# Patient Record
Sex: Male | Born: 1965 | Race: White | Hispanic: No | Marital: Single | Smoking: Current every day smoker
Health system: Southern US, Community
[De-identification: ages and names within clinical notes are randomized; demographics above are authoritative.]

## PROBLEM LIST (undated history)

## (undated) DIAGNOSIS — K219 Gastro-esophageal reflux disease without esophagitis: Secondary | ICD-10-CM

## (undated) DIAGNOSIS — F1910 Other psychoactive substance abuse, uncomplicated: Secondary | ICD-10-CM

## (undated) HISTORY — DX: Gastro-esophageal reflux disease without esophagitis: K21.9

## (undated) HISTORY — DX: Other psychoactive substance abuse, uncomplicated: F19.10

## (undated) HISTORY — PX: KNEE ARTHROSCOPY: SHX127

## (undated) HISTORY — PX: WRIST SURGERY: SHX841

---

## 1997-10-04 ENCOUNTER — Emergency Department (HOSPITAL_COMMUNITY): Admission: EM | Admit: 1997-10-04 | Discharge: 1997-10-04 | Payer: Self-pay | Admitting: Emergency Medicine

## 1998-03-03 ENCOUNTER — Encounter: Payer: Self-pay | Admitting: Internal Medicine

## 1998-03-03 ENCOUNTER — Emergency Department (HOSPITAL_COMMUNITY): Admission: EM | Admit: 1998-03-03 | Discharge: 1998-03-03 | Payer: Self-pay | Admitting: Internal Medicine

## 1998-10-15 ENCOUNTER — Inpatient Hospital Stay (HOSPITAL_COMMUNITY): Admission: EM | Admit: 1998-10-15 | Discharge: 1998-10-19 | Payer: Self-pay | Admitting: Emergency Medicine

## 1998-11-08 ENCOUNTER — Emergency Department (HOSPITAL_COMMUNITY): Admission: EM | Admit: 1998-11-08 | Discharge: 1998-11-08 | Payer: Self-pay | Admitting: Emergency Medicine

## 1998-11-09 ENCOUNTER — Inpatient Hospital Stay (HOSPITAL_COMMUNITY): Admission: EM | Admit: 1998-11-09 | Discharge: 1998-11-14 | Payer: Self-pay | Admitting: Emergency Medicine

## 2001-10-06 ENCOUNTER — Encounter (INDEPENDENT_AMBULATORY_CARE_PROVIDER_SITE_OTHER): Payer: Self-pay | Admitting: Specialist

## 2001-10-07 ENCOUNTER — Inpatient Hospital Stay (HOSPITAL_COMMUNITY): Admission: EM | Admit: 2001-10-07 | Discharge: 2001-10-14 | Payer: Self-pay | Admitting: Emergency Medicine

## 2001-10-08 ENCOUNTER — Encounter: Payer: Self-pay | Admitting: Gastroenterology

## 2001-10-09 ENCOUNTER — Encounter: Payer: Self-pay | Admitting: Internal Medicine

## 2001-10-10 ENCOUNTER — Encounter: Payer: Self-pay | Admitting: Internal Medicine

## 2001-10-11 ENCOUNTER — Encounter: Payer: Self-pay | Admitting: Internal Medicine

## 2001-11-13 ENCOUNTER — Emergency Department (HOSPITAL_COMMUNITY): Admission: EM | Admit: 2001-11-13 | Discharge: 2001-11-13 | Payer: Self-pay | Admitting: Emergency Medicine

## 2002-09-12 ENCOUNTER — Emergency Department (HOSPITAL_COMMUNITY): Admission: EM | Admit: 2002-09-12 | Discharge: 2002-09-12 | Payer: Self-pay | Admitting: Emergency Medicine

## 2002-09-12 ENCOUNTER — Encounter: Payer: Self-pay | Admitting: Emergency Medicine

## 2003-01-09 ENCOUNTER — Encounter: Admission: RE | Admit: 2003-01-09 | Discharge: 2003-01-09 | Payer: Self-pay | Admitting: Neurosurgery

## 2003-01-09 ENCOUNTER — Encounter: Payer: Self-pay | Admitting: Neurosurgery

## 2003-04-17 ENCOUNTER — Emergency Department (HOSPITAL_COMMUNITY): Admission: EM | Admit: 2003-04-17 | Discharge: 2003-04-17 | Payer: Self-pay | Admitting: Emergency Medicine

## 2004-03-05 ENCOUNTER — Emergency Department (HOSPITAL_COMMUNITY): Admission: EM | Admit: 2004-03-05 | Discharge: 2004-03-05 | Payer: Self-pay | Admitting: Emergency Medicine

## 2005-07-31 ENCOUNTER — Emergency Department (HOSPITAL_COMMUNITY): Admission: EM | Admit: 2005-07-31 | Discharge: 2005-07-31 | Payer: Self-pay | Admitting: Emergency Medicine

## 2005-09-07 ENCOUNTER — Emergency Department (HOSPITAL_COMMUNITY): Admission: EM | Admit: 2005-09-07 | Discharge: 2005-09-07 | Payer: Self-pay | Admitting: Emergency Medicine

## 2005-10-09 ENCOUNTER — Emergency Department (HOSPITAL_COMMUNITY): Admission: EM | Admit: 2005-10-09 | Discharge: 2005-10-10 | Payer: Self-pay | Admitting: Emergency Medicine

## 2006-08-31 IMAGING — CR DG RIBS W/ CHEST 3+V BILAT
8 series · 8 of 8 positions shown · non-contrast
Comparison: Chest radiograph 03/05/2004.

CLINICAL DATA: Back pain, chest pain. 
 BILATERAL RIBS WITH CHEST - 9 VIEWS:

[w chest pa]
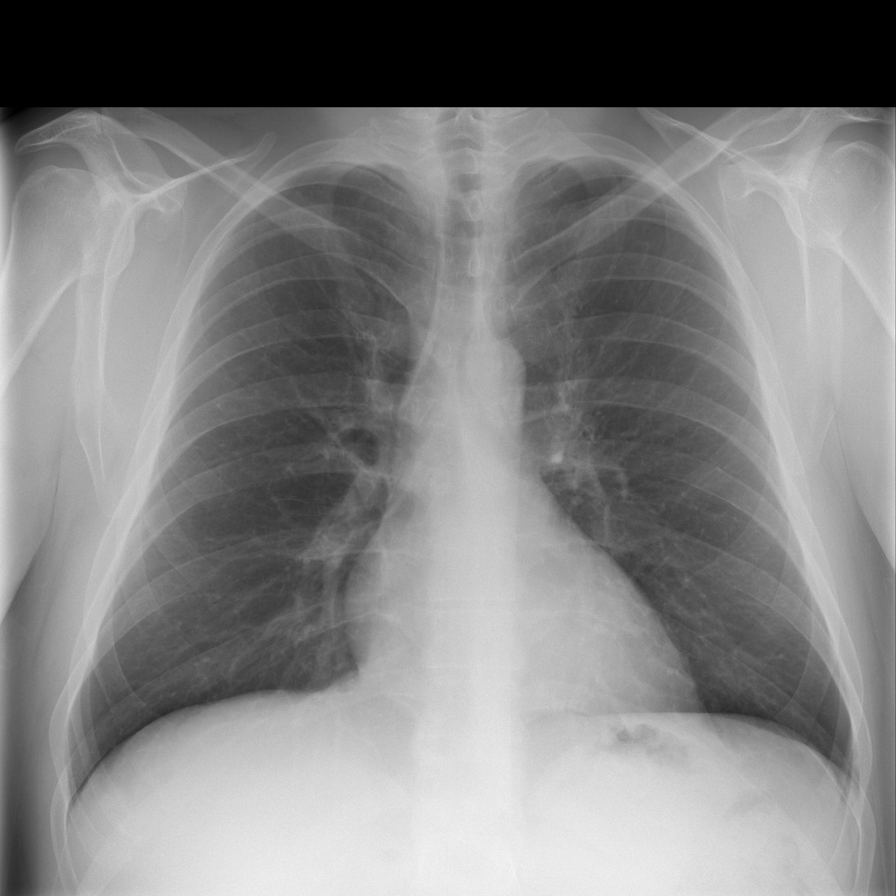

[w ribs ap/pa upper left *]
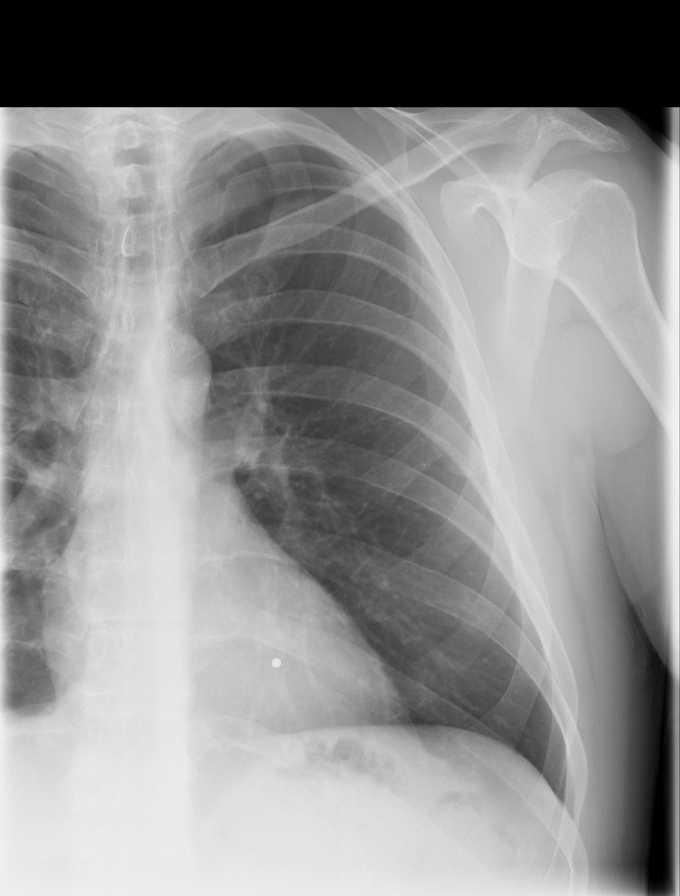

[w ribs ap/pa lower left]
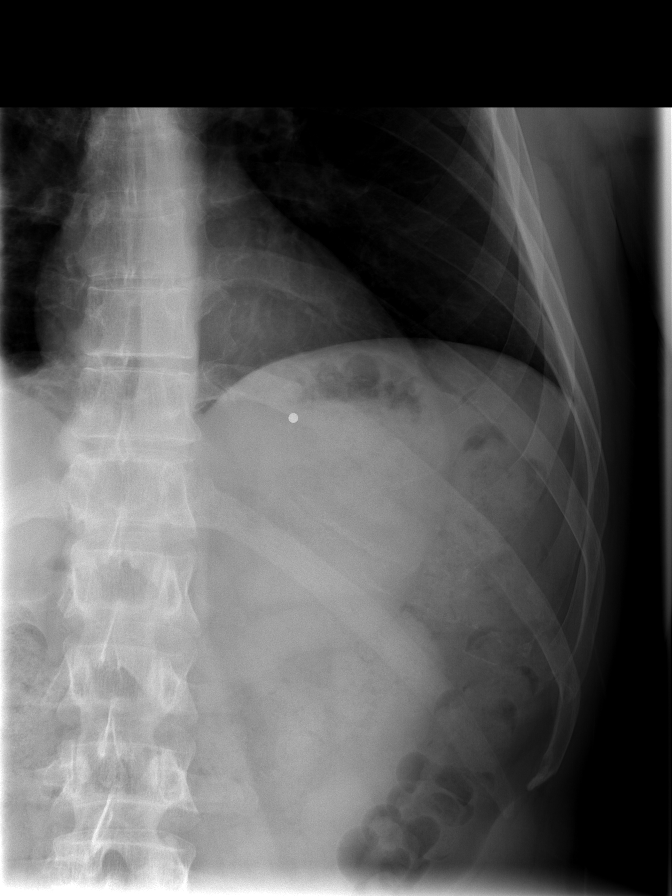

[w ribs oblique left (1 of 2)]
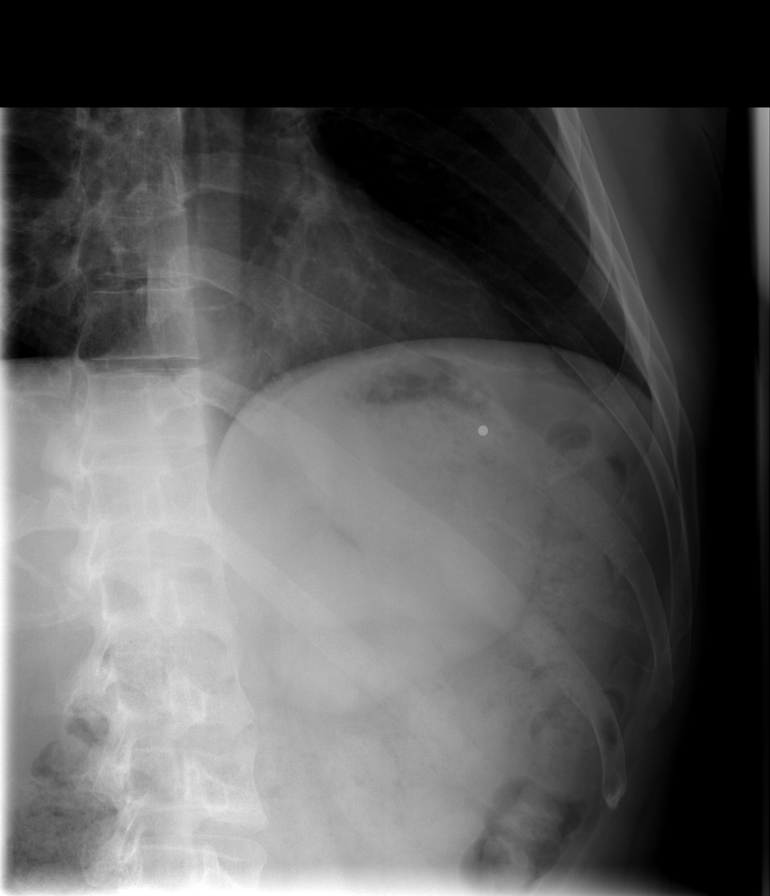

[w ribs oblique left (2 of 2)]
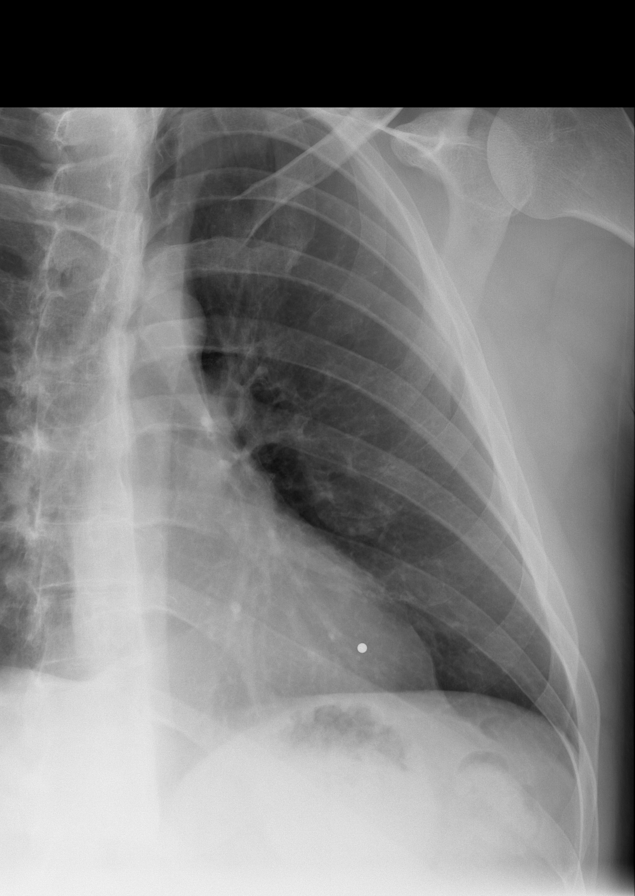

[w ribs ap/pa upper right]
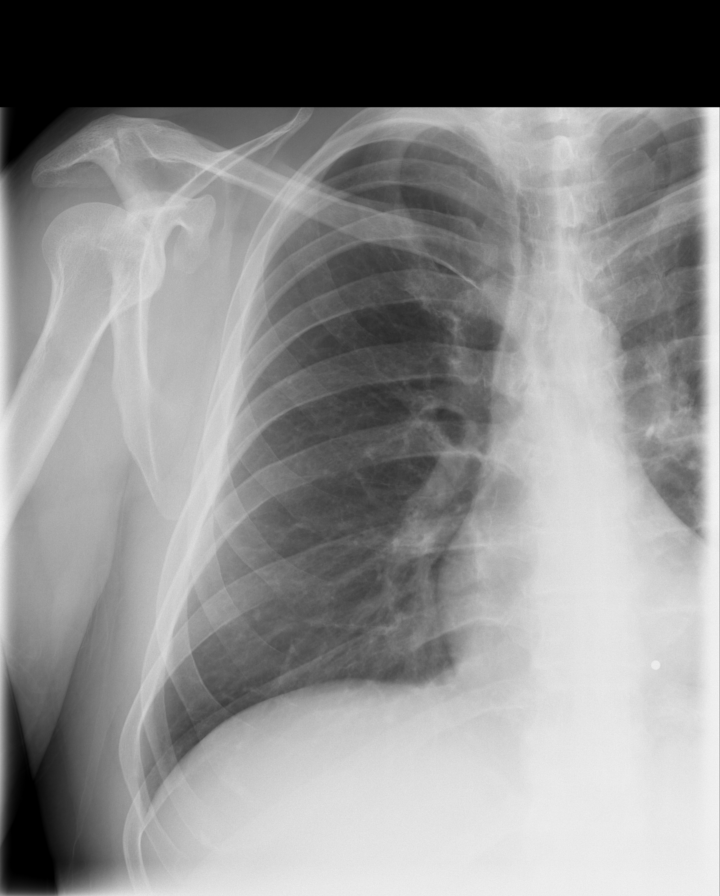

[w ribs ap/pa lower right]
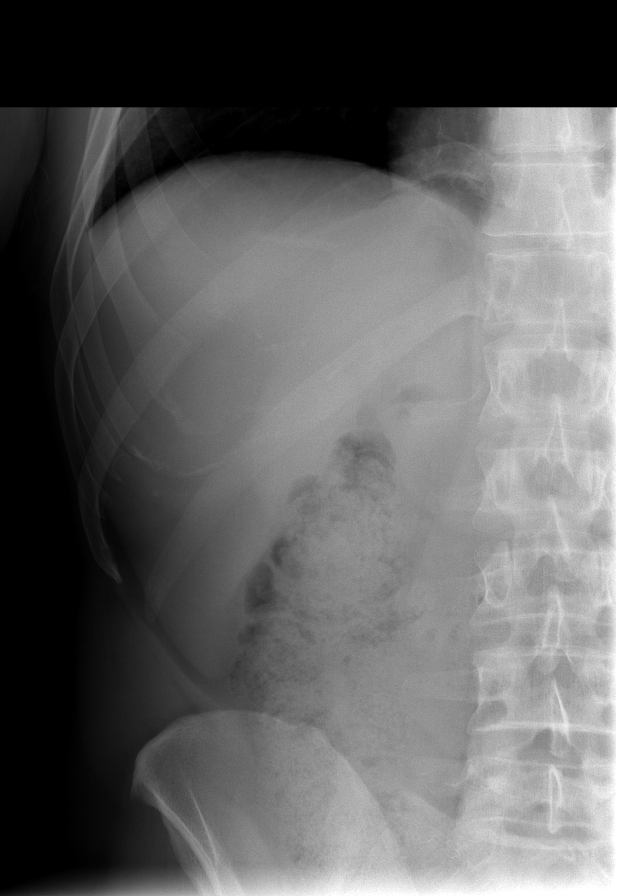

[w ribs oblique right]
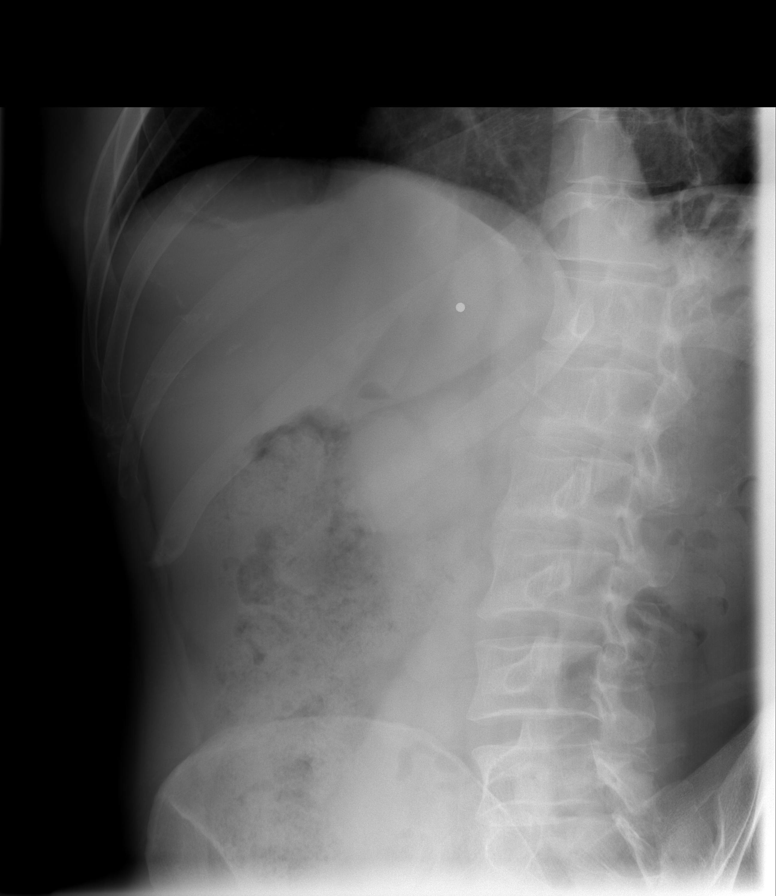

[8 of 8 positions shown; findings below may reference images not displayed]

FINDINGS: No visible pneumothorax or pleural effusion.  No rib fracture is detected.
IMPRESSION: No acute thoracic findings are radiographically apparent.

## 2006-10-11 ENCOUNTER — Emergency Department (HOSPITAL_COMMUNITY): Admission: EM | Admit: 2006-10-11 | Discharge: 2006-10-11 | Payer: Self-pay | Admitting: Emergency Medicine

## 2010-05-02 HISTORY — PX: BACK SURGERY: SHX140

## 2010-05-24 ENCOUNTER — Encounter: Payer: Self-pay | Admitting: Family Medicine

## 2010-07-05 ENCOUNTER — Ambulatory Visit (HOSPITAL_COMMUNITY)
Admission: RE | Admit: 2010-07-05 | Discharge: 2010-07-05 | Disposition: A | Discharge status: Home / Self Care | Payer: Worker's Compensation | Source: Ambulatory Visit | Attending: Orthopedic Surgery | Admitting: Orthopedic Surgery

## 2010-07-05 ENCOUNTER — Other Ambulatory Visit (HOSPITAL_COMMUNITY): Payer: Self-pay | Admitting: Orthopedic Surgery

## 2010-07-05 ENCOUNTER — Encounter (HOSPITAL_COMMUNITY)
Admission: RE | Admit: 2010-07-05 | Discharge: 2010-07-05 | Disposition: A | Discharge status: Home / Self Care | Payer: Worker's Compensation | Source: Ambulatory Visit | Attending: Orthopedic Surgery | Admitting: Orthopedic Surgery

## 2010-07-05 DIAGNOSIS — Z01818 Encounter for other preprocedural examination: Secondary | ICD-10-CM | POA: Insufficient documentation

## 2010-07-05 DIAGNOSIS — Z01812 Encounter for preprocedural laboratory examination: Secondary | ICD-10-CM | POA: Insufficient documentation

## 2010-07-05 DIAGNOSIS — Z01810 Encounter for preprocedural cardiovascular examination: Secondary | ICD-10-CM | POA: Insufficient documentation

## 2010-07-05 LAB — CBC
MCH: 31.4 pg (ref 26.0–34.0)
MCHC: 34.8 g/dL (ref 30.0–36.0)
Platelets: 255 10*3/uL (ref 150–400)
RBC: 5.41 MIL/uL (ref 4.22–5.81)
RDW: 13.5 % (ref 11.5–15.5)
WBC: 9.1 10*3/uL (ref 4.0–10.5)

## 2010-07-05 LAB — COMPREHENSIVE METABOLIC PANEL
ALT: 36 U/L (ref 0–53)
Albumin: 4.5 g/dL (ref 3.5–5.2)
Alkaline Phosphatase: 80 U/L (ref 39–117)
BUN: 7 mg/dL (ref 6–23)
Creatinine, Ser: 1.19 mg/dL (ref 0.4–1.5)
GFR calc Af Amer: >60 mL/min (ref >60)
Glucose, Bld: 76 mg/dL (ref 70–99)
Total Bilirubin: 0.5 mg/dL (ref 0.3–1.2)
Total Protein: 7.2 g/dL (ref 6.0–8.3)

## 2010-07-05 LAB — SURGICAL PCR SCREEN
MRSA, PCR: NEGATIVE
Staphylococcus aureus: POSITIVE — AB

## 2010-07-07 ENCOUNTER — Ambulatory Visit (HOSPITAL_COMMUNITY): Payer: Worker's Compensation

## 2010-07-07 ENCOUNTER — Observation Stay (HOSPITAL_COMMUNITY)
Admission: RE | Admit: 2010-07-07 | Discharge: 2010-07-08 | Disposition: A | Discharge status: Home / Self Care | Payer: Worker's Compensation | Source: Ambulatory Visit | Attending: Orthopedic Surgery | Admitting: Orthopedic Surgery

## 2010-07-07 DIAGNOSIS — M216X9 Other acquired deformities of unspecified foot: Secondary | ICD-10-CM | POA: Insufficient documentation

## 2010-07-07 DIAGNOSIS — K219 Gastro-esophageal reflux disease without esophagitis: Secondary | ICD-10-CM | POA: Insufficient documentation

## 2010-07-07 DIAGNOSIS — M48061 Spinal stenosis, lumbar region without neurogenic claudication: Principal | ICD-10-CM | POA: Insufficient documentation

## 2010-07-07 DIAGNOSIS — F17200 Nicotine dependence, unspecified, uncomplicated: Secondary | ICD-10-CM | POA: Insufficient documentation

## 2010-07-15 NOTE — Op Note (Signed)
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  OPERATIVE PROCEDURE:  Left lateral recess decompression L5-S1 with foraminotomy and diskectomy.  COMPLICATIONS:  None.  CONDITION:  Stable.  HISTORY:  This is a very pleasant 45 year old gentleman who has been having progressive weakness in his left lower extremity, pain, and positive nerve root tension signs for about 8 months status post a work injury.  All attempts at conservative management have failed.  His MRI was less than profound.  There was no significant stenosis; however, I did disagree with the radiologist and I felt like as though the disk bulge was causing some foraminal compromise that could in fact be irritating the S1 nerve root and accounted for his weakness.  After multiple discussions and reviewing all pertinent risks and benefits of surgery, the patient had elected to proceed with a surgical decompression and partial diskectomy.  All risks and benefits were reviewed and consent was obtained.  OPERATIVE NOTE:  The patient was brought to the operating room, placed supine on the operating table.  After successful induction of general anesthesia and endotracheal intubation, TEDs, SCDs were applied.  He was turned prone onto the Wilson frame and the arms were placed overhead and padded.  The back was prepped and draped in standard fashion.  A preoperative time-out was then done to confirm patient,  procedure, and the affected extremity.  Once this was completed, an 18-gauge needle was placed in the posterior aspect of the spine to identify the level.  I then made a small 1-inch incision and then bluntly dissected to the deep fascia, incised the deep fascia, and then advanced the dilating portal to the L5-S1 disk space.  I confirmed using fluoroscopy that I was at the appropriate level.  I then sequentially dilated and used the winding technique to mobilize the paraspinal muscles.  I then placed the access tubes over the forearm, expanded them, and secured into the table.  I then removed some of the paraspinal muscle that had crept in underneath the retractor and then identified the L5 lamina.  I placed a Penfield 4 underneath the lamina and took an intraoperative x-ray to confirm that I was at the L5 lamina at the L5-S1 disk space.  Once this was done, I then used a 2- and 3-mm Kerrison to perform a generous L5 laminotomy and then used a 2-mm Kerrison to release the lamina from the leading edge of the S1 lamina.  A 2-mm Kerrison released the ligamentum flavum from the leading edge of the S1 lamina.  I then resected the ligamentum flavum to expose the underlying  thecal sac.  I then dissected with a Penfield 4 into the lateral recess until I could identify the S1 nerve root.  I then continued removing the overhanging osteophyte from the inferior L5 facet complex.  Once the lateral recess decompression was complete, I was able to visualize the disk space.  I incised the annulus, and using just a micropituitary rongeur as well as reversed obtained Epstein curettes, I made sure there was no free fragments of disk material.  With the diskectomy complete, I then used a 2- and 3-mm Kerrison to perform a generous foraminotomy.  At this point, I used a Public house manager.  I could pass it superiorly along the lateral recess inferiorly with the S1 nerve root and towards the S1 neural foramen  and circumferentially at the disk space.  At this point with the decompression complete and the S1 nerve root completely free of any tension, I irrigated the wound copiously with normal saline.  Using bipolar electrocautery, I obtained hemostasis and then used the thrombin- soaked Gelfoam patty over the exposed thecal sac and then closed in a layered fashion with interrupted 0 Vicryl sutures, 2-0 Vicryl sutures, and a 3-0 Monocryl for the skin.  Steri-Strips and dry dressing were applied.  The patient was extubated, transferred to the PACU without incident.  At the end of the case, all needle and sponge counts were correct.     Alvy Beal, MD     DDB/MEDQ  D:  07/07/2010  T:  07/08/2010  Job:  [id number]  Electronically Signed by Venita Lick MD on 07/15/2010 11:47:08 AM

## 2010-09-17 NOTE — Discharge Summary (Signed)
[facility]  Patient:    Ruben Navarro, Ruben Navarro Visit Number: [id number] MRN: [id number]          Service Type: MED Location: [phone] Attending Physician:  Ruben Navarro Dictated by:   Ruben Navarro, P.A.-C Admit Date:  10/06/2001 Discharge Date: 10/14/2001   CC:         Ruben Navarro, M.D.   Discharge Summary  ADMISSION DIAGNOSES: 62. A 45 year old white male with probable acute cholecystitis, rule out    choledocholithiasis. 2. Jaundice felt secondary to #1. 3. Status post remote right knee arthroscopy. 4. History of vocal cord polyps. 5. History of substance abuse. 6. History of bipolar disorder.  DISCHARGE DIAGNOSES: 9. A 45 year old white male with acute severe hepatitis, viral versus other    toxin induced hepatopathy.  Clinically improving with no evidence for    fulminant hepatitis or hepatic failure. 2. Drug seeking behavior. 3. History of polysubstance abuse.  CONSULTATIONS:  The patient was admitted by Ruben Navarro, M.D. of surgery, transferred to GI service, Ruben Navarro. Ruben Navarro, M.D.  PROCEDURES: 1. Upper abdominal ultrasound. 2. CT scan of the abdomen and pelvis. 3. Endoscopic retrograde cholangiopancreatography. 4. Chest x-ray. 5. Ultrasound guided percutaneous liver biopsy.  HISTORY OF PRESENT ILLNESS:  Ruben Navarro is a 45 year old white male admitted unassigned by Ruben Navarro via the ER where he presented with the acute onset of right upper quadrant pain x3 to 4 days.  This had been associated with nausea and vomiting, dark urine, and chills.  The patient underwent evaluation in the emergency room showing evidence of jaundice with a bilirubin of 6.1, AST and ALT both in the 2000s, white blood cell count was 11,000.  CT scan of the abdomen and pelvis was done showing a thickened gallbladder wall, and the patient was therefore admitted to the surgical service with probable acute cholecystitis, could not  rule out the possibility of choledocholithiasis.  LABORATORY DATA:  On admission, 10/06/01, white blood cell count 6.9, hemoglobin 15.1, hematocrit 43.6, MCV 93.5, platelets 91.  Followup on 10/09/01, white blood cell count 16,000, hemoglobin 15.5, hematocrit 44.6, platelets 124. Followup on 10/12/01, white blood cell count 7.3, hemoglobin 14.8, hematocrit 42.8, MCV 93.5, and platelets 157.  Prothrombin time 15.9, INR 1.3, PTT 31. Followup on 10/10/01, prothrombin time 14.0, INR 1.1.  On 10/06/01, sodium was 134, potassium 3.7, glucose 82, BUN 7, creatinine 1.1, albumin 3.0.  Serial values again obtained on 10/09/01, electrolytes within normal limits, albumin 3.2.  On admission, 10/06/01, total bilirubin 6.1, alkaline phosphatase 175, AST 1464, ALT 1961.  Followup on 10/09/01, total bilirubin of 11.4, alkaline phosphatase 191, AST 581, ALT 1366.  Followup on 10/11/01, total bilirubin 12.6, alkaline phosphatase 214, AST 526, ALT 944.  On 10/12/01, total bilirubin of 11.6, alkaline phosphatase 207, AST 363, ALT 751.  Amylase and lipase were normal on admission.  Quantitative immunoglobulins IgD elevated slightly at 1780, IgA normal at 142, and IgM slightly elevated at 264.  Hepatitis C antibody was positive.  Hepatitis B surface antigen negative.  Hepatitis A IgM negative.  Hepatitis B core IgM negative.  Hepatitis C RNA PCR quant 277,000. HIV nonreactive.  Hepatitis BE antigen was drawn and is pending.  CMV IgG positive.  Mono-spot negative.  Drug screen done on 10/07/01, positive for amphetamines and cannabinoids.  Alcohol level was less than 5 on admission.  X-ray studies:  Chest x-ray on 10/07/01, was negative.  CT scan of the abdomen and pelvis on 10/07/01, shows  bibasilar dependent subsegmental atelectasis, stranding around the gallbladder, as well as abnormal density centrally in the gallbladder, potentially representing a gallstone or abnormal mural enhancement.  Appearance is suspicious for acute  cholecystitis.  There is also some evidence of celiac nodes and stranding in the mesentery especially around the gallbladder, and also noted an abnormal amount of free pelvic fluid. Endoscopic retrograde cholangiopancreatography on 10/08/01, was negative.  There was some filling of the gallbladder, indicating cystic duct patency, no evidence of gallstones in the gallbladder, though it was incompletely filled. Upper abdominal ultrasound on 10/09/01, showed the appearance of the liver and gallbladder more compatible with the hepatitis, and it was felt that the appearance of the gallbladder on prior CT more compatible with edema of the gallbladder wall secondary to hepatitis rather than cholecystitis, no gallstones, normal common bile duct, and an enlarged spleen.  There was also a small right effusion.  Ultrasound guided liver biopsy done on 10/10/01, CT of the abdomen and pelvis repeated on 10/11/01, showed no acute abnormality in the abdomen, and no evidence of complication from prior liver biopsy.  The gallbladder wall thickening had decreased.  HOSPITAL COURSE:  The patient was admitted to the service of Ruben Navarro on the surgical service with possible cholecystitis.  He was initially placed on IV Unasyn high dose, given Demerol and Phenergan as needed for control of pain and nausea, placed on a clear liquid diet.  He was noted to be jaundiced at the time of admission with a bilirubin of 6.1, and GI consult was obtained for consideration of endoscopic retrograde cholangiopancreatography to rule out choledocholithiasis.  He was seen in consult by Ruben Navarro for GI, and endoscopic retrograde cholangiopancreatography was scheduled for 10/08/01.  This was a negative exam, was also able to put dye into the gallbladder consistent with cystic duct patency.  The patient tolerated the procedure without difficulty.  Had no evidence of pancreatitis post-procedure.  By the following morning, he  was continuing to complain of pain.  He stated he felt a little bit better, still  having some nausea and vomiting.  His bilirubin had gone up to 11.4, transaminases were slowly improving.  We repeated coags which were also still within the normal range.  It was unclear at that time whether he really had a gallbladder process or an underlying hepatitis with perigallbladder edema.  He was continued empirically on IV antibiotics, and on 10/09/01, ultrasound was done with findings as outlined above, being more consistent with an acute hepatitis.  On further discussion with the patient, he did admit to a long history of illicit drug use, but said he had not used any cocaine etc., for approximately 30 days.  Drug screen, however, was positive for marijuana and amphetamines.  The patient had apparently not had a prior history of hepatitis.  We subsequently sent off hepatitis A, B, C serologies, CMV serologies, and HIV screen.  He was hepatitis C positive, and eventually his PCR quant returned positive for active viral tider.  It is quite unusual for patients to present with acute hepatitis C, especially with the degree of illness he had, and it was felt that possibly his hepatitis was due to some other toxin ingested from his drug use.  A decision was made to proceed with liver biopsy when his bilirubin continued to rise, and this was done on 10/10/01.  The patient tolerated the procedure well, however, complained of fairly severe right-sided abdominal pain the following day, stated that he was  having a difficult time with inspiration, that his pain had not improved any within 12 hours of biopsy.  For that reason, CT scan of the abdomen and pelvis was repeated to rule out a hematoma or other complication of the liver biopsy. CT scan was unremarkable for any evidence of hematoma or complications. Hemoglobin was also stable.  Objectively, he began improving.  Bilirubin peaked, and then began  resolving.  The patient continued to complain of a rather severe pain, and had continuous requests for narcotics.  He was eventually switched over to oral pain medication.  We had intended on giving him Darvocet-N 100 to minimize narcotic use, however, he negotiated with Dr. Virginia Rochester prior to discharge for Percodan, and had asked for a "non-buffered pain medication."  By that time, he was felt to be stable, improved, had no evidence of hepatic failure, no evidence for coagulopathy, and his bilirubin and transaminases were improving.  Arrangements were made for followup in the office with Dr. Lina Sar on October 30, 2001, at 10 a.m.  He was to call for any problems in the interim.  He was given a prescription for Percodan per Dr. Virginia Rochester for a limited supply.  Also, Protonix 40 mg p.o. q.d., and Ativan 1 mg t.i.d. p.r.n. for anxiety.  Liver biopsy was pending at the time of discharge, and is still available in the chart at the time of this dictation.  This does show evidence of acute and chronic hepatitis with tan lobular inflammatory cell infiltrates with spotting necrosis and individual hepatocyte necrosis. The infiltrate is predominately lymphoplasmocytic with scattered macrophagias and a few neutrophils.  Mildly inflamed bile ducts.  No increased fibrosis. No increased iron identified with iron stain.  This was felt most consistent with an acute hepatitis.  Differentials of viral hepatitis, as well as a drug induced hepatitis.  A dense portal lymphoplasmocytic infiltrates are consistent with the presence of anti-hepatitis C antibody serology, and probably represents a component of chronic active hepatitis C. Dictated by:   Ruben Navarro, P.A.-C Attending Physician:  Ruben Navarro DD:  10/29/01 TD:  10/30/01 Job: [id number]

## 2011-05-29 IMAGING — RF DG LUMBAR SPINE 1V
1 series · 2 of 2 positions shown · non-contrast
Comparison: 07/05/2010

CLINICAL DATA: L5-S1 decompression and discectomy

LUMBAR SPINE - 1 VIEW

[Series 1: run · 2 of 2 slices shown]
[im 1/2]
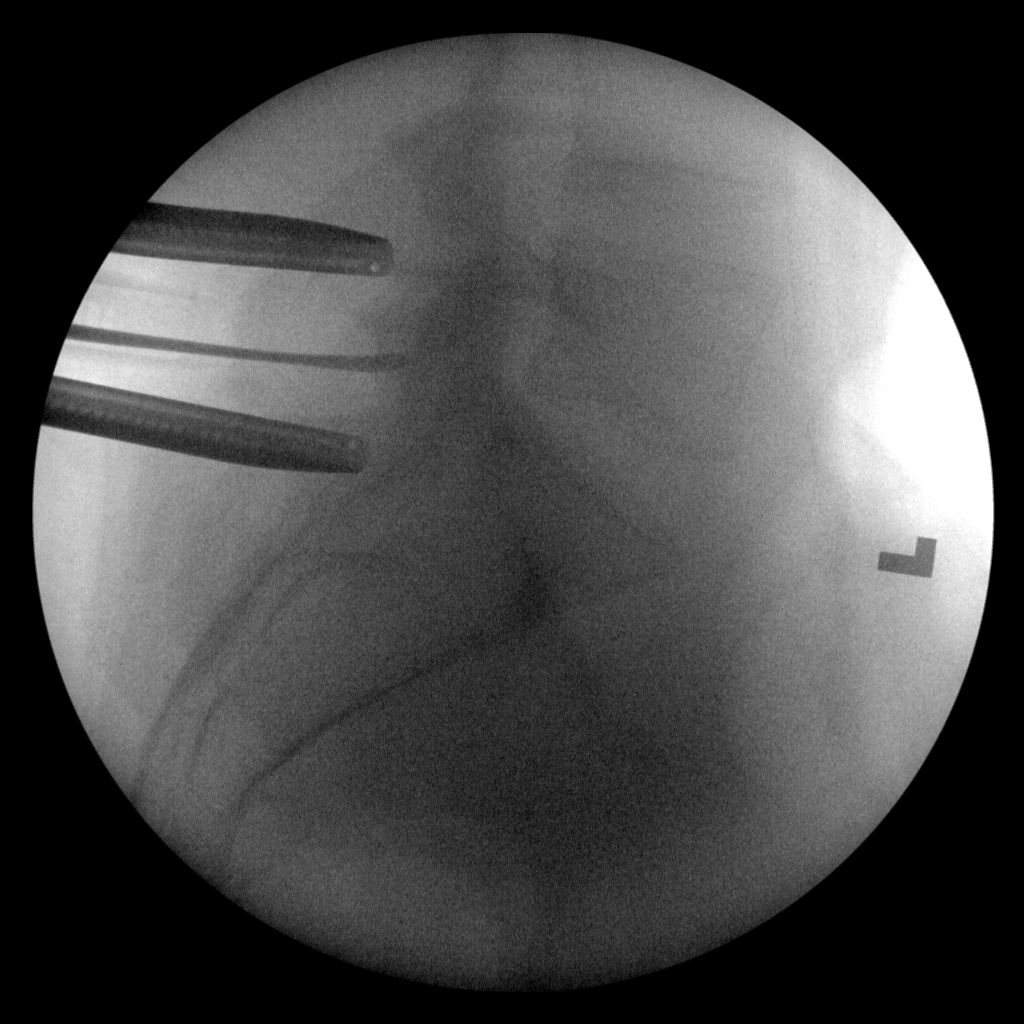
[im 2/2]
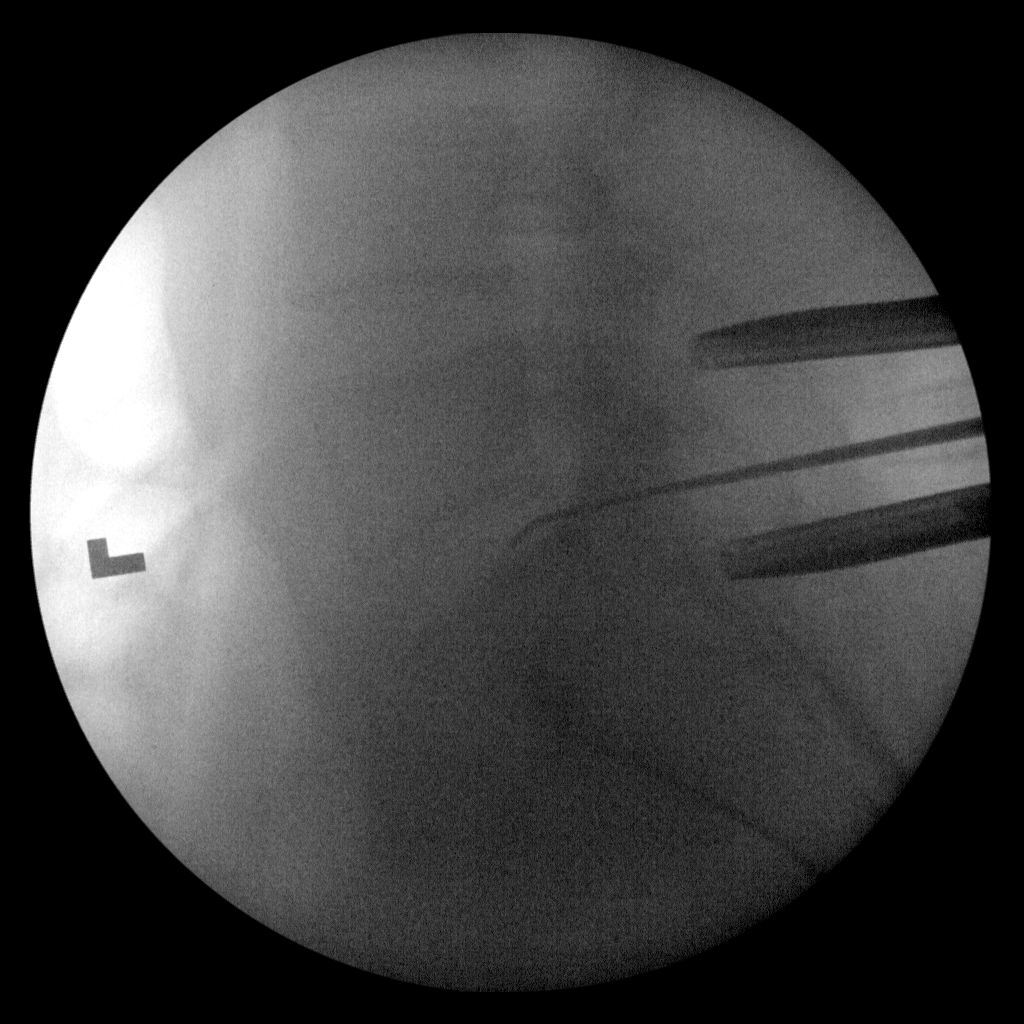

[2 of 2 positions shown; findings below may reference images not displayed]

FINDINGS: There are tissue spreaders identified posterior to the L5
and S1 vertebra.  The surgical probe is directed towards the L5-S1
disc space.
IMPRESSION: 1.  Portable intraoperative radiographs obtained for lumbar spine
level localization.

## 2015-04-07 ENCOUNTER — Ambulatory Visit (INDEPENDENT_AMBULATORY_CARE_PROVIDER_SITE_OTHER): Payer: BLUE CROSS/BLUE SHIELD | Admitting: Family Medicine

## 2015-04-07 VITALS — BP 148/102 | HR 66 | Temp 97.5°F | Resp 17 | Ht 72.0 in | Wt 232.0 lb

## 2015-04-07 DIAGNOSIS — M79642 Pain in left hand: Secondary | ICD-10-CM

## 2015-04-07 DIAGNOSIS — Z23 Encounter for immunization: Secondary | ICD-10-CM

## 2015-04-07 DIAGNOSIS — S61402A Unspecified open wound of left hand, initial encounter: Secondary | ICD-10-CM

## 2015-04-07 DIAGNOSIS — Z2839 Other underimmunization status: Secondary | ICD-10-CM

## 2015-04-07 DIAGNOSIS — Z283 Underimmunization status: Secondary | ICD-10-CM

## 2015-04-07 MED ORDER — AMOXICILLIN-POT CLAVULANATE 875-125 MG PO TABS: 1.0000 | ORAL_TABLET | Freq: Two times a day (BID) | ORAL | Status: DC

## 2015-04-07 MED ORDER — OXYCODONE-ACETAMINOPHEN 5-325 MG PO TABS: 1.0000 | ORAL_TABLET | ORAL | Status: DC | PRN

## 2015-04-07 MED ORDER — HYDROCODONE-ACETAMINOPHEN 5-325 MG PO TABS: 1.0000 | ORAL_TABLET | Freq: Four times a day (QID) | ORAL | Status: DC | PRN

## 2015-04-07 NOTE — Progress Notes (Signed)
Patient ID: Santina EvansStephen Shannon Craigo, male    DOB: Mar 03, 1966  Age: 49 y.o. MRN: [id number]  Chief Complaint  Patient presents with  . Hand Injury    left hand     Subjective:   49 year old man who was using a grinder at home and sliced left second MCP knuckle. He has not had a tetanus shot recently. He did this at home, not at work.  Current allergies, medications, problem list, past/family and social histories reviewed.  Objective:  BP 148/102 mmHg  Pulse 66  Temp(Src) 97.5 F (36.4 C) (Oral)  Resp 17  Ht 6' (1.829 m)  Wt 232 lb (105.235 kg)  BMI 31.46 kg/m2  SpO2 98%  2 cm wound of left second MCP. The glistening white tissue seen is probably pending, could be bone. It needs to be cleaned up. Extension is weak, but I believe that is from pain and not from anatomic problem. Sensory grossly intact.  Assessment & Plan:   Assessment: 1. Open wound of left hand, initial encounter   2. Immunization deficiency       Plan: Wound hand. Recheck after anesthetizing.  After the wound had been anesthetized and was reexamined he still was weak in the extension. It is a diagonal cut into the tendon, difficult to separate tell the percentage of damage breath it was felt to be significant enough to need to refer to hand orthopedics. The wound was dirty and was scrubbed repeatedly. We will closed the wound surface and have him see orthopedic hand tomorrow. Decided to place on prophylactic Augmentin.  Patient says the hydrocodone never helps him. That he requires oxycodone. I told him I would only give him a few him he can discuss further with the hand surgery.. Tetanus vaccine Orders Placed This Encounter  Procedures  . Tdap vaccine greater than or equal to 7yo IM    No orders of the defined types were placed in this encounter.         There are no Patient Instructions on file for this visit.   No Follow-up on file.   HOPPER,DAVID, MD 04/07/2015

## 2015-04-07 NOTE — Progress Notes (Signed)
Procedure: Verbal consent obtained. Skin was anesthetized with 4 cc 1% lido without epi and cleaned with soap and water under pressure. A large amount debris removed. Longitudinal laceration of extensor tendon seen. Laceration loosely sutured with #4 simple 5.0 ethilon sutures. Wound was dressed and wound care discussed.

## 2015-04-07 NOTE — Patient Instructions (Addendum)
Referral will be made to Dr.Gramig tomorrow. If you do not hear from our office by late morning please call and asked to speak to [facility].  Take the Augmentin antibiotic one twice daily for infection prevention  Keep wound clean and dry tonight  Take Percocet 5 (oxycodone) one every 4-6 hours as needed for pain  Take hydrocodone one every 4-6 hours if needed for pain  Tdap Vaccine (Tetanus, Diphtheria and Pertussis): What You Need to Know 1. Why get vaccinated? Tetanus, diphtheria and pertussis are very serious diseases. Tdap vaccine can protect [place] from these diseases. And, Tdap vaccine given to pregnant women can protect newborn babies against pertussis. TETANUS (Lockjaw) is Percocet 5 in the [place] today. It causes painful muscle tightening and stiffness, usually all over the body.  It can lead to tightening of muscles in the head and neck so you can't open your mouth, swallow, or sometimes even breathe. Tetanus kills about 1 out of 10 people who are infected even after receiving the best medical care. DIPHTHERIA is also rare in the [place] today. It can cause a thick coating to form in the back of the throat.  It can lead to breathing problems, heart failure, paralysis, and death. PERTUSSIS (Whooping Cough) causes severe coughing spells, which can cause difficulty breathing, vomiting and disturbed sleep.  It can also lead to weight loss, incontinence, and rib fractures. Up to 2 in 100 adolescents and 5 in 100 adults with pertussis are hospitalized or have complications, which could include pneumonia or death. These diseases are caused by bacteria. Diphtheria and pertussis are spread from person to person through secretions from coughing or sneezing. Tetanus enters the body through cuts, scratches, or wounds. Before vaccines, as many as 200,000 cases of diphtheria, 200,000 cases of pertussis, and hundreds of cases of tetanus, were reported in the [place] each year. Since  vaccination began, reports of cases for tetanus and diphtheria have dropped by about 99% and for pertussis by about 80%. 2. Tdap vaccine Tdap vaccine can protect adolescents and adults from tetanus, diphtheria, and pertussis. One dose of Tdap is routinely given at age 34 or 46. People who did not get Tdap at that age should get it as soon as possible. Tdap is especially important for healthcare professionals and anyone having close contact with a baby younger than 12 months. Pregnant women should get a dose of Tdap during every pregnancy, to protect the newborn from pertussis. Infants are most at risk for severe, life-threatening complications from pertussis. Another vaccine, called Td, protects against tetanus and diphtheria, but not pertussis. A Td booster should be given every 10 years. Tdap may be given as one of these boosters if you have never gotten Tdap before. Tdap may also be given after a severe cut or burn to prevent tetanus infection. Your doctor or the person giving you the vaccine can give you more information. Tdap may safely be given at the same time as other vaccines. 3. Some people should not get this vaccine  A person who has ever had a life-threatening allergic reaction after a previous dose of any diphtheria, tetanus or pertussis containing vaccine, OR has a severe allergy to any part of this vaccine, should not get Tdap vaccine. Tell the person giving the vaccine about any severe allergies.  Anyone who had coma or long repeated seizures within 7 days after a childhood dose of DTP or DTaP, or a previous dose of Tdap, should not get Tdap, unless a  cause other than the vaccine was found. They can still get Td.  Talk to your doctor if you:  have seizures or another nervous system problem,  had severe pain or swelling after any vaccine containing diphtheria, tetanus or pertussis,  ever had a condition called Guillain-Barr Syndrome (GBS),  aren't feeling well on the day the  shot is scheduled. 4. Risks With any medicine, including vaccines, there is a chance of side effects. These are usually mild and go away on their own. Serious reactions are also possible but are rare. Most people who get Tdap vaccine do not have any problems with it. Mild problems following Tdap (Did not interfere with activities)  Pain where the shot was given (about 3 in 4 adolescents or 2 in 3 adults)  Redness or swelling where the shot was given (about 1 person in 5)  Mild fever of at least 100.24F (up to about 1 in 25 adolescents or 1 in 100 adults)  Headache (about 3 or 4 people in 10)  Tiredness (about 1 person in 3 or 4)  Nausea, vomiting, diarrhea, stomach ache (up to 1 in 4 adolescents or 1 in 10 adults)  Chills, sore joints (about 1 person in 10)  Body aches (about 1 person in 3 or 4)  Rash, swollen glands (uncommon) Moderate problems following Tdap (Interfered with activities, but did not require medical attention)  Pain where the shot was given (up to 1 in 5 or 6)  Redness or swelling where the shot was given (up to about 1 in 16 adolescents or 1 in 12 adults)  Fever over 102F (about 1 in 100 adolescents or 1 in 250 adults)  Headache (about 1 in 7 adolescents or 1 in 10 adults)  Nausea, vomiting, diarrhea, stomach ache (up to 1 or 3 people in 100)  Swelling of the entire arm where the shot was given (up to about 1 in 500). Severe problems following Tdap (Unable to perform usual activities; required medical attention)  Swelling, severe pain, bleeding and redness in the arm where the shot was given (rare). Problems that could happen after any vaccine:  People sometimes faint after a medical procedure, including vaccination. Sitting or lying down for about 15 minutes can help prevent fainting, and injuries caused by a fall. Tell your doctor if you feel dizzy, or have vision changes or ringing in the ears.  Some people get severe pain in the shoulder and have  difficulty moving the arm where a shot was given. This happens very rarely.  Any medication can cause a severe allergic reaction. Such reactions from a vaccine are very rare, estimated at fewer than 1 in a million doses, and would happen within a few minutes to a few hours after the vaccination. As with any medicine, there is a very remote chance of a vaccine causing a serious injury or death. The safety of vaccines is always being monitored. For more information, visit: http://floyd.org/www.cdc.gov/vaccinesafety/ 5. What if there is a serious problem? What should I look for?  Look for anything that concerns you, such as signs of a severe allergic reaction, very high fever, or unusual behavior.  Signs of a severe allergic reaction can include hives, swelling of the face and throat, difficulty breathing, a fast heartbeat, dizziness, and weakness. These would usually start a few minutes to a few hours after the vaccination. What should I do?  If you think it is a severe allergic reaction or other emergency that can't wait, call 9-1-1  or get the person to the nearest hospital. Otherwise, call your doctor.  Afterward, the reaction should be reported to the Vaccine Adverse Event Reporting System (VAERS). Your doctor might file this report, or you can do it yourself through the VAERS web site at www.vaers.LAgents.no, or by calling 1-810-475-0826. VAERS does not give medical advice.  6. The National Vaccine Injury Compensation Program The [employer] Vaccine Injury Compensation Program (VICP) is a federal program that was created to compensate people who may have been injured by certain vaccines. Persons who believe they may have been injured by a vaccine can learn about the program and about filing a claim by calling 1-250 632 7120 or visiting the VICP website at SpiritualWord.at. There is a time limit to file a claim for compensation. 7. How can I learn more?  Ask your doctor. He or she can give you the  vaccine package insert or suggest other sources of information.  Call your local or state health department.  Contact the Centers for Disease Control and Prevention (CDC):  Call (720) 775-7278 (1-800-CDC-INFO) or  Visit CDC's website at PicCapture.uy CDC Tdap Vaccine VIS (06/25/13)   This information is not intended to replace advice given to you by your health care provider. Make sure you discuss any questions you have with your health care provider.   Document Released: 10/18/2011 Document Revised: 05/09/2014 Document Reviewed: 07/31/2013 Elsevier Interactive Patient Education [employer]

## 2016-01-11 ENCOUNTER — Ambulatory Visit (INDEPENDENT_AMBULATORY_CARE_PROVIDER_SITE_OTHER): Payer: Worker's Compensation | Admitting: Family Medicine

## 2016-01-11 ENCOUNTER — Encounter: Payer: Self-pay | Admitting: Family Medicine

## 2016-01-11 DIAGNOSIS — T1500XA Foreign body in cornea, unspecified eye, initial encounter: Secondary | ICD-10-CM | POA: Insufficient documentation

## 2016-01-11 DIAGNOSIS — T1502XA Foreign body in cornea, left eye, initial encounter: Secondary | ICD-10-CM

## 2016-01-11 MED ORDER — CIPROFLOXACIN HCL 0.3 % OP SOLN: 1.0000 [drp] | OPHTHALMIC | 0 refills | Status: DC

## 2016-01-11 NOTE — Progress Notes (Signed)
   Subjective:    Patient ID: Ruben EvansStephen Shannon Urbanski, male    DOB: 1966/01/08, 50 y.o.   MRN: [id number]  Chief Complaint  Patient presents with  . Other    FB in left eye per patient     PCP: No primary care provider on file.  HPI  This is a 50 y.o. male who is presenting with eye complaints. He was grinding at work and was using his safety goggles. When he was putting on his regular glasses he felt debris fall from his hair and into his eye. He rubbed his eye and has tried to wash it but hasn't had any improvement. Feels worse with blinding and sharp in nature. He can't see as well out of his eye. His eye is watering.    Review of Systems  ROS: No unexpected weight loss, fever, chills, swelling, instability, muscle pain, numbness/tingling, redness, otherwise see HPI   PMH: none  PShx: none PSx: current tobacco user     Objective:   Physical Exam BP 132/80 (BP Location: Right Arm, Patient Position: Sitting, Cuff Size: Normal)   Pulse (!) 58   Temp 98 F (36.7 C) (Oral)   Resp 17   Ht 6' 0.5" (1.842 m)   Wt 254 lb (115.2 kg)   SpO2 97%   BMI 33.98 kg/m  Gen: NAD, alert, cooperative with exam,  HEENT: injected conjunctiva on left. Foreign body in the 9 o'clock position. No rust ring after removal of foreign body  Skin: no rashes, normal turgor  Neuro: no gross deficits.  Psych: alert and oriented    Visual Acuity Screening   Right eye Left eye Both eyes  Without correction:     With correction: 20/20 20/40 20/20   Comments: 2nd Vision Screening With Correction OU 20/15, OD 20/15, OS 20/20  Procedure: Cornea foreign body removal   Procainamide was used to anesthetize the left eye. A q tip was wetted and used to remove the foreign body with no complications. Fluorescein stain was used and observed to have an abrasion in the 9' oclock and 6 o'clock position.      Assessment & Plan:   Foreign body in cornea Metal foreign body was removed with no complications.  This occurred while he was at work today. He had abrasions at the 6 o'clock and 9 o'clock position. No rust ring was observed after the removal  - he was provided cipro drops  - advised to make an appointment to be seen on Wednesday  - he was provided a work note with no restrictions

## 2016-01-11 NOTE — Assessment & Plan Note (Signed)
Metal foreign body was removed with no complications. This occurred while he was at work today. He had abrasions at the 6 o'clock and 9 o'clock position. No rust ring was observed after the removal  - he was provided cipro drops  - advised to make an appointment to be seen on Wednesday  - he was provided a work note with no restrictions

## 2016-01-11 NOTE — Patient Instructions (Addendum)
Thank you for coming in,   Please call tomorrow afternoon after 4 pm to make an appointment to be seen on 01/13/16.   Please call us if you have any vision changes.    Please feel free to call with any questions or concerns at any time, at [phone]. --Dr. Jordan LikesSchmitz     IF you received an x-ray today, you will receive [facility] from [facility]. Please contact [facility] at [phone] with questions or concerns regarding your invoice.   IF you received labwork today, you will receive an invoice from [facility]. Please contact [facility] at [phone] with questions or concerns regarding your invoice.   Our billing staff will not be able to assist you with questions regarding bills from these companies.  You will be contacted with the lab results as soon as they are available. The fastest way to get your results is to activate your My Chart account. Instructions are located on the last page of this paperwork. If you have not heard from us regarding the results in 2 weeks, please contact this office.

## 2016-01-13 NOTE — Progress Notes (Signed)
Patient discussed, examined, and treated with Dr. Noreene FilbertSchmidt. Agree with findings, assessment and plan of care per his note. Anterior chamber clear,  Proparacaine 2 gtts applied,with less soreness after gtts. Lids everted, no foreign body identified. Foreign body overlying cornea/sclera easily removed with saline soaked cotton tipped swab without complications.Fluorescein applied, small area of uptake in previous location of foreign body. Also small area of uptake inferiorly, negative Seidel sign.  Flushed with sterile water, no complications.   Cover for resultant corneal abrasion with Ciloxan. rtc precautions.

## 2019-04-01 ENCOUNTER — Other Ambulatory Visit: Payer: Self-pay

## 2019-04-01 DIAGNOSIS — Z20822 Contact with and (suspected) exposure to covid-19: Secondary | ICD-10-CM

## 2019-04-03 LAB — NOVEL CORONAVIRUS, NAA: SARS-CoV-2, NAA: NOT DETECTED

## 2019-04-05 ENCOUNTER — Telehealth: Payer: Self-pay | Admitting: *Deleted

## 2019-04-05 NOTE — Telephone Encounter (Signed)
Patient called for covid results ,given negative results . 

## 2020-07-06 ENCOUNTER — Other Ambulatory Visit: Payer: Self-pay

## 2020-07-06 ENCOUNTER — Ambulatory Visit (INDEPENDENT_AMBULATORY_CARE_PROVIDER_SITE_OTHER): Payer: BC Managed Care – PPO

## 2020-07-06 ENCOUNTER — Ambulatory Visit: Payer: BC Managed Care – PPO | Admitting: Podiatry

## 2020-07-06 DIAGNOSIS — G5761 Lesion of plantar nerve, right lower limb: Secondary | ICD-10-CM | POA: Diagnosis not present

## 2020-07-06 DIAGNOSIS — S9031XA Contusion of right foot, initial encounter: Secondary | ICD-10-CM | POA: Diagnosis not present

## 2020-07-20 NOTE — Progress Notes (Signed)
   HPI: 55 y.o. male presenting today as a new patient referral for evaluation of right forefoot pain has been going on for over 6 months now.  He has been seeing Dr. Elijah Birk, local podiatrist, for the last 4 months and he is received steroidal injections repeatedly.  He is also tried different shoe gear modifications with minimal improvement.  He was referred here for possible surgical consultation and discuss further treatment options  No past medical history on file.   Physical Exam: General: The patient is alert and oriented x3 in no acute distress.  Dermatology: Skin is warm, dry and supple bilateral lower extremities. Negative for open lesions or macerations.  Vascular: Palpable pedal pulses bilaterally. No edema or erythema noted. Capillary refill within normal limits.  Neurological: Epicritic and protective threshold grossly intact bilaterally.  Pain with compression to the interdigital nerves second and third interspace right forefoot with shooting sensations down to the toes.  Patient has paresthesia numbness also between the second and third toes the right foot  Musculoskeletal Exam: Range of motion within normal limits to all pedal and ankle joints bilateral. Muscle strength 5/5 in all groups bilateral.  Pain with compression of the interdigital nerves right foot with a positive Mulder sign.  Pain with lateral compression of the metatarsals.  Radiographic Exam:  Normal osseous mineralization. Joint spaces preserved. No fracture/dislocation/boney destruction.    Assessment: 1.  Morton's neuroma second and third interspace right foot   Plan of Care:  1. Patient evaluated. X-Rays reviewed.  2. Today we discussed the conservative versus surgical management of the presenting pathology. The patient opts for surgical management. All possible complications and details of the procedure were explained. All patient questions were answered. No guarantees were expressed or implied. 3.  Authorization for surgery was initiated today. Surgery will consist of excision of Morton's neuroma second and third interspace right foot 4.  Return to clinic 1 week postop        Felecia Shelling, DPM [facility]  Dr. Felecia Shelling, DPM    [address] [place], [place] [address]                Office [phone]  Fax [phone]

## 2020-07-30 ENCOUNTER — Telehealth: Payer: Self-pay | Admitting: Urology

## 2020-07-30 NOTE — Telephone Encounter (Signed)
DOS - 08/20/20  NEURECTOMY X2 RIGHT --- 53646   BCBS EFFECTIVE DATE - 01/01/20   PLAN DEDUCTIBLE - $2,500.00 W/ $8,032.12 REMAINING OUT OF POCKET -$3,000.00 W/ $3,000.00 REMAINING COPAY - $0.00 COINSURANCE - 30%   PER BCBS WEBSITE NO PRIOR AUTH IS REQUIRED FOR CPT CODE 24825.

## 2020-08-12 DIAGNOSIS — M79676 Pain in unspecified toe(s): Secondary | ICD-10-CM

## 2020-08-20 ENCOUNTER — Encounter: Payer: Self-pay | Admitting: Podiatry

## 2020-08-20 ENCOUNTER — Other Ambulatory Visit: Payer: Self-pay | Admitting: Podiatry

## 2020-08-20 DIAGNOSIS — G5761 Lesion of plantar nerve, right lower limb: Secondary | ICD-10-CM | POA: Diagnosis not present

## 2020-08-20 MED ORDER — OXYCODONE-ACETAMINOPHEN 5-325 MG PO TABS: 1.0000 | ORAL_TABLET | ORAL | 0 refills | Status: DC | PRN

## 2020-08-20 NOTE — Progress Notes (Signed)
PRN postop 

## 2020-08-26 ENCOUNTER — Ambulatory Visit (INDEPENDENT_AMBULATORY_CARE_PROVIDER_SITE_OTHER): Payer: BC Managed Care – PPO | Admitting: Podiatry

## 2020-08-26 ENCOUNTER — Ambulatory Visit (INDEPENDENT_AMBULATORY_CARE_PROVIDER_SITE_OTHER): Payer: BC Managed Care – PPO

## 2020-08-26 ENCOUNTER — Other Ambulatory Visit: Payer: Self-pay

## 2020-08-26 DIAGNOSIS — G5761 Lesion of plantar nerve, right lower limb: Secondary | ICD-10-CM | POA: Diagnosis not present

## 2020-08-26 DIAGNOSIS — Z9889 Other specified postprocedural states: Secondary | ICD-10-CM

## 2020-08-26 MED ORDER — OXYCODONE-ACETAMINOPHEN 5-325 MG PO TABS: 1.0000 | ORAL_TABLET | Freq: Four times a day (QID) | ORAL | 0 refills | Status: DC | PRN

## 2020-08-26 NOTE — Progress Notes (Signed)
   Subjective:  Patient presents today status post excision of Morton's neuroma second and third interspace right foot. DOS: 08/20/2020.  Patient states that he is doing well.  The pain is tolerable with the pain medication.  He has been weightbearing in the cam boot as instructed.  No new complaints at this time  No past medical history on file.    Objective/Physical Exam Neurovascular status intact.  Skin incisions appear to be well coapted with sutures intact. No sign of infectious process noted. No dehiscence. No active bleeding noted. Moderate edema noted to the surgical extremity.  Radiographic Exam:  Normal osseous mineralization.  No fractures identified.  Joint spaces preserved.  Assessment: 1. s/p excision of Morton's neuroma second and third interspace right foot. DOS: 08/20/2020   Plan of Care:  1. Patient was evaluated. X-rays reviewed 2.  Dressings changed today.  Clean dry and intact x1 week 3.  Continue weightbearing in the cam boot 4.  Return to clinic in 1 week for suture removal 5.  Refill prescription for Percocet 5/325 mg   Felecia Shelling, DPM [facility]  Dr. Felecia Shelling, DPM    [address] [place], [place] [address]                Office [phone]  Fax [phone]

## 2020-09-02 ENCOUNTER — Encounter: Payer: BC Managed Care – PPO | Admitting: Podiatry

## 2020-09-02 ENCOUNTER — Encounter: Payer: Self-pay | Admitting: Podiatry

## 2020-09-07 ENCOUNTER — Ambulatory Visit (INDEPENDENT_AMBULATORY_CARE_PROVIDER_SITE_OTHER): Payer: BC Managed Care – PPO | Admitting: Podiatry

## 2020-09-07 ENCOUNTER — Other Ambulatory Visit: Payer: Self-pay

## 2020-09-07 DIAGNOSIS — Z9889 Other specified postprocedural states: Secondary | ICD-10-CM

## 2020-09-16 ENCOUNTER — Encounter: Payer: BC Managed Care – PPO | Admitting: Podiatry

## 2020-09-22 NOTE — Progress Notes (Signed)
   Subjective:  Patient presents today status post excision of Morton's neuroma second and third interspace right foot. DOS: 08/20/2020.  Patient states that he is doing well.  The pain is tolerable with the pain medication.  He has been weightbearing in the cam boot as instructed.  No new complaints at this time  No past medical history on file.    Objective/Physical Exam Neurovascular status intact.  Skin incisions appear to be well coapted with sutures intact. No sign of infectious process noted. No dehiscence. No active bleeding noted. Moderate edema noted to the surgical extremity.  Radiographic Exam:  Normal osseous mineralization.  No fractures identified.  Joint spaces preserved.  Assessment: 1. s/p excision of Morton's neuroma second and third interspace right foot. DOS: 08/20/2020   Plan of Care:  1. Patient was evaluated. 2.  Sutures removed today 3.  Patient may now transition out of the cam boot into good supportive stability sneakers 4.  Return to clinic in 4 weeks   Felecia Shelling, DPM [facility]  Dr. Felecia Shelling, DPM    [address] [place], [place] [address]                Office [phone]  Fax [phone]

## 2020-10-08 ENCOUNTER — Other Ambulatory Visit: Payer: Self-pay

## 2020-10-12 ENCOUNTER — Ambulatory Visit (INDEPENDENT_AMBULATORY_CARE_PROVIDER_SITE_OTHER): Payer: BC Managed Care – PPO | Admitting: Podiatry

## 2020-10-12 ENCOUNTER — Encounter: Payer: Self-pay | Admitting: Podiatry

## 2020-10-12 ENCOUNTER — Other Ambulatory Visit: Payer: Self-pay

## 2020-10-12 DIAGNOSIS — Z9889 Other specified postprocedural states: Secondary | ICD-10-CM

## 2020-10-12 NOTE — Progress Notes (Signed)
   Subjective:  Patient presents today status post excision of Morton's neuroma second and third interspace right foot. DOS: 08/20/2020.  Patient states that he is doing very well.  He has been walking in tennis shoes and sneakers with no complaints.  He does have some random numbness but overall doing very well and he is very satisfied.  He says that there is significant improvement since prior to surgery  No past medical history on file.    Objective/Physical Exam Neurovascular status intact.  Skin incisions appear to be well coapted and healed. No sign of infectious process noted. No dehiscence. No active bleeding noted.  Negative for any significant edema noted to the surgical extremity.   Assessment: 1. s/p excision of Morton's neuroma second and third interspace right foot. DOS: 08/20/2020   Plan of Care:  1. Patient was evaluated. 2.  Patient is doing very well.  He has no pain whatsoever.  He does have some very random intermittent "zingers" that are very minimal and short-lived 3.  Continue walking and good supportive shoes and sneakers 4.  Patient may return to work 10/19/2020 full activity no restrictions 5.  Return to clinic as needed  Felecia Shelling, DPM [facility]  Dr. Felecia Shelling, DPM    [address] [place], [place] [address]                Office [phone]  Fax [phone]

## 2020-10-13 ENCOUNTER — Encounter: Payer: Self-pay | Admitting: Podiatry

## 2023-01-08 ENCOUNTER — Encounter (HOSPITAL_COMMUNITY): Payer: Self-pay | Admitting: Family Medicine

## 2023-01-08 ENCOUNTER — Emergency Department (HOSPITAL_BASED_OUTPATIENT_CLINIC_OR_DEPARTMENT_OTHER): Payer: BC Managed Care – PPO | Admitting: Radiology

## 2023-01-08 ENCOUNTER — Other Ambulatory Visit: Payer: Self-pay

## 2023-01-08 ENCOUNTER — Inpatient Hospital Stay (HOSPITAL_BASED_OUTPATIENT_CLINIC_OR_DEPARTMENT_OTHER)
Admission: EM | Admit: 2023-01-08 | Discharge: 2023-01-10 | DRG: 580 | Disposition: A | Discharge status: Home / Self Care | Payer: BC Managed Care – PPO | Attending: Internal Medicine | Admitting: Internal Medicine

## 2023-01-08 DIAGNOSIS — L03114 Cellulitis of left upper limb: Secondary | ICD-10-CM | POA: Diagnosis not present

## 2023-01-08 DIAGNOSIS — Z888 Allergy status to other drugs, medicaments and biological substances status: Secondary | ICD-10-CM

## 2023-01-08 DIAGNOSIS — F17210 Nicotine dependence, cigarettes, uncomplicated: Secondary | ICD-10-CM | POA: Diagnosis present

## 2023-01-08 DIAGNOSIS — K219 Gastro-esophageal reflux disease without esophagitis: Secondary | ICD-10-CM | POA: Diagnosis not present

## 2023-01-08 DIAGNOSIS — I82612 Acute embolism and thrombosis of superficial veins of left upper extremity: Secondary | ICD-10-CM | POA: Diagnosis present

## 2023-01-08 DIAGNOSIS — F1410 Cocaine abuse, uncomplicated: Secondary | ICD-10-CM | POA: Insufficient documentation

## 2023-01-08 DIAGNOSIS — Z791 Long term (current) use of non-steroidal anti-inflammatories (NSAID): Secondary | ICD-10-CM

## 2023-01-08 DIAGNOSIS — Z79899 Other long term (current) drug therapy: Secondary | ICD-10-CM | POA: Diagnosis not present

## 2023-01-08 DIAGNOSIS — T8029XA Infection following other infusion, transfusion and therapeutic injection, initial encounter: Principal | ICD-10-CM

## 2023-01-08 DIAGNOSIS — B954 Other streptococcus as the cause of diseases classified elsewhere: Secondary | ICD-10-CM | POA: Diagnosis present

## 2023-01-08 DIAGNOSIS — L03119 Cellulitis of unspecified part of limb: Secondary | ICD-10-CM | POA: Diagnosis present

## 2023-01-08 DIAGNOSIS — L02414 Cutaneous abscess of left upper limb: Secondary | ICD-10-CM | POA: Diagnosis not present

## 2023-01-08 DIAGNOSIS — M79602 Pain in left arm: Secondary | ICD-10-CM | POA: Diagnosis present

## 2023-01-08 DIAGNOSIS — L02419 Cutaneous abscess of limb, unspecified: Secondary | ICD-10-CM | POA: Diagnosis present

## 2023-01-08 LAB — HIV ANTIBODY (ROUTINE TESTING W REFLEX): HIV Screen 4th Generation wRfx: NONREACTIVE

## 2023-01-08 LAB — BASIC METABOLIC PANEL
Anion gap: 9 (ref 5–15)
BUN: 10 mg/dL (ref 6–20)
CO2: 23 mmol/L (ref 22–32)
Calcium: 9.2 mg/dL (ref 8.9–10.3)
Chloride: 106 mmol/L (ref 98–111)
Creatinine, Ser: 0.75 mg/dL (ref 0.61–1.24)
GFR, Estimated: >60 mL/min (ref >60)
Glucose, Bld: 96 mg/dL (ref 70–99)
Potassium: 4 mmol/L (ref 3.5–5.1)
Sodium: 138 mmol/L (ref 135–145)

## 2023-01-08 LAB — CBC WITH DIFFERENTIAL/PLATELET
Abs Immature Granulocytes: 0.02 10*3/uL (ref 0.00–0.07)
Basophils Absolute: 0 10*3/uL (ref 0.0–0.1)
Basophils Relative: 0 %
Eosinophils Absolute: 0.2 10*3/uL (ref 0.0–0.5)
Eosinophils Relative: 2 %
HCT: 47.7 % (ref 39.0–52.0)
Hemoglobin: 16.4 g/dL (ref 13.0–17.0)
Immature Granulocytes: 0 %
Lymphocytes Relative: 23 %
Lymphs Abs: 1.8 10*3/uL (ref 0.7–4.0)
MCH: 31.8 pg (ref 26.0–34.0)
MCHC: 34.4 g/dL (ref 30.0–36.0)
MCV: 92.6 fL (ref 80.0–100.0)
Monocytes Absolute: 0.6 10*3/uL (ref 0.1–1.0)
Monocytes Relative: 7 %
Neutro Abs: 5.5 10*3/uL (ref 1.7–7.7)
Neutrophils Relative %: 68 %
Platelets: 230 10*3/uL (ref 150–400)
RBC: 5.15 MIL/uL (ref 4.22–5.81)
RDW: 12.7 % (ref 11.5–15.5)
WBC: 8.1 10*3/uL (ref 4.0–10.5)
nRBC: 0 % (ref 0.0–0.2)

## 2023-01-08 MED ORDER — LACTATED RINGERS IV SOLN: INTRAVENOUS | Status: AC

## 2023-01-08 MED ORDER — FENTANYL CITRATE PF 50 MCG/ML IJ SOSY
50.0000 ug | PREFILLED_SYRINGE | Freq: Once | INTRAMUSCULAR | Status: AC
  Administered 2023-01-08: 50 ug via INTRAVENOUS
  Filled 2023-01-08: qty 1

## 2023-01-08 MED ORDER — METOPROLOL TARTRATE 5 MG/5ML IV SOLN: 5.0000 mg | Freq: Four times a day (QID) | INTRAVENOUS | Status: DC | PRN

## 2023-01-08 MED ORDER — ONDANSETRON HCL 4 MG/2ML IJ SOLN
4.0000 mg | Freq: Four times a day (QID) | INTRAMUSCULAR | Status: DC | PRN
  Administered 2023-01-09: 4 mg via INTRAVENOUS

## 2023-01-08 MED ORDER — LIDOCAINE-EPINEPHRINE (PF) 2 %-1:200000 IJ SOLN
10.0000 mL | Freq: Once | INTRAMUSCULAR | Status: AC
  Administered 2023-01-08: 10 mL
  Filled 2023-01-08: qty 20

## 2023-01-08 MED ORDER — ENOXAPARIN SODIUM 40 MG/0.4ML IJ SOSY
40.0000 mg | PREFILLED_SYRINGE | INTRAMUSCULAR | Status: DC
  Administered 2023-01-08 – 2023-01-10 (×2): 40 mg via SUBCUTANEOUS
  Filled 2023-01-08 (×2): qty 0.4

## 2023-01-08 MED ORDER — ACETAMINOPHEN 500 MG PO TABS
1000.0000 mg | ORAL_TABLET | Freq: Four times a day (QID) | ORAL | Status: DC
  Administered 2023-01-08 – 2023-01-10 (×5): 1000 mg via ORAL
  Filled 2023-01-08 (×6): qty 2

## 2023-01-08 MED ORDER — VANCOMYCIN HCL 2000 MG/400ML IV SOLN
2000.0000 mg | Freq: Once | INTRAVENOUS | Status: DC
Start: 2023-01-08 — End: 2023-01-08

## 2023-01-08 MED ORDER — NICOTINE 21 MG/24HR TD PT24
21.0000 mg | MEDICATED_PATCH | Freq: Once | TRANSDERMAL | Status: AC
  Administered 2023-01-08: 21 mg via TRANSDERMAL
  Filled 2023-01-08: qty 1

## 2023-01-08 MED ORDER — ONDANSETRON HCL 4 MG PO TABS: 4.0000 mg | ORAL_TABLET | Freq: Four times a day (QID) | ORAL | Status: DC | PRN

## 2023-01-08 MED ORDER — OXYCODONE HCL 5 MG PO TABS: 5.0000 mg | ORAL_TABLET | ORAL | Status: DC | PRN

## 2023-01-08 MED ORDER — VANCOMYCIN HCL IN DEXTROSE 1-5 GM/200ML-% IV SOLN
1000.0000 mg | Freq: Once | INTRAVENOUS | Status: AC
  Administered 2023-01-08: 1000 mg via INTRAVENOUS
  Filled 2023-01-08: qty 200

## 2023-01-08 MED ORDER — POLYETHYLENE GLYCOL 3350 17 G PO PACK: 17.0000 g | PACK | Freq: Every day | ORAL | Status: DC | PRN

## 2023-01-08 MED ORDER — VANCOMYCIN HCL IN DEXTROSE 1-5 GM/200ML-% IV SOLN
1000.0000 mg | Freq: Once | INTRAVENOUS | Status: DC
  Filled 2023-01-08: qty 200

## 2023-01-08 MED ORDER — DOXYCYCLINE HYCLATE 100 MG PO TABS
100.0000 mg | ORAL_TABLET | Freq: Two times a day (BID) | ORAL | Status: DC
  Administered 2023-01-08 – 2023-01-09 (×3): 100 mg via ORAL
  Filled 2023-01-08 (×3): qty 1

## 2023-01-08 MED ORDER — FENTANYL CITRATE PF 50 MCG/ML IJ SOSY: 12.5000 ug | PREFILLED_SYRINGE | INTRAMUSCULAR | Status: DC | PRN

## 2023-01-08 MED ORDER — NICOTINE 21 MG/24HR TD PT24
21.0000 mg | MEDICATED_PATCH | Freq: Every day | TRANSDERMAL | Status: DC
  Administered 2023-01-09 – 2023-01-10 (×2): 21 mg via TRANSDERMAL
  Filled 2023-01-08 (×2): qty 1

## 2023-01-08 MED ORDER — CEFAZOLIN SODIUM-DEXTROSE 1-4 GM/50ML-% IV SOLN
1.0000 g | Freq: Once | INTRAVENOUS | Status: AC
  Administered 2023-01-08: 1 g via INTRAVENOUS
  Filled 2023-01-08: qty 50

## 2023-01-08 MED ORDER — IBUPROFEN 200 MG PO TABS
600.0000 mg | ORAL_TABLET | Freq: Four times a day (QID) | ORAL | Status: DC
  Administered 2023-01-08 – 2023-01-10 (×7): 600 mg via ORAL
  Filled 2023-01-08 (×8): qty 3

## 2023-01-08 NOTE — Progress Notes (Signed)
Patient has arrived to the unit via EMS. A/O x 4. No Distress noted at this time. VSS. Oriented patient to the room and staff. Education provided regarding safety precaution and patient verbalize understanding. Redness and swollen noted on his left arm. Upon arrival LR running 125 ml/hrs.
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
## 2023-01-08 NOTE — Plan of Care (Signed)
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
## 2023-01-08 NOTE — ED Notes (Signed)
[facility] at [facility] will send transport for Bed Ready at [facility] RM# [id number](NS)

## 2023-01-08 NOTE — ED Triage Notes (Signed)
PT denies current TDAP

## 2023-01-08 NOTE — ED Triage Notes (Signed)
Pt to triage c/o left arm pain 7/10 ache in nature x 2 days. Pt presents with golf ball size abscess to medial AC. VSS NAD, PT denies fever or chills.

## 2023-01-09 ENCOUNTER — Inpatient Hospital Stay (HOSPITAL_COMMUNITY): Payer: BC Managed Care – PPO | Admitting: Anesthesiology

## 2023-01-09 ENCOUNTER — Encounter (HOSPITAL_COMMUNITY)
Admission: EM | Disposition: A | Discharge status: Home / Self Care | Payer: Self-pay | Source: Home / Self Care | Attending: Obstetrics and Gynecology

## 2023-01-09 ENCOUNTER — Encounter (HOSPITAL_COMMUNITY): Payer: Self-pay | Admitting: Family Medicine

## 2023-01-09 ENCOUNTER — Other Ambulatory Visit: Payer: Self-pay

## 2023-01-09 ENCOUNTER — Inpatient Hospital Stay (HOSPITAL_COMMUNITY): Payer: BC Managed Care – PPO

## 2023-01-09 DIAGNOSIS — L03119 Cellulitis of unspecified part of limb: Secondary | ICD-10-CM | POA: Diagnosis not present

## 2023-01-09 DIAGNOSIS — F1410 Cocaine abuse, uncomplicated: Secondary | ICD-10-CM | POA: Insufficient documentation

## 2023-01-09 DIAGNOSIS — F17210 Nicotine dependence, cigarettes, uncomplicated: Secondary | ICD-10-CM | POA: Diagnosis present

## 2023-01-09 DIAGNOSIS — M79602 Pain in left arm: Secondary | ICD-10-CM | POA: Diagnosis present

## 2023-01-09 DIAGNOSIS — L02419 Cutaneous abscess of limb, unspecified: Secondary | ICD-10-CM | POA: Diagnosis not present

## 2023-01-09 DIAGNOSIS — Z791 Long term (current) use of non-steroidal anti-inflammatories (NSAID): Secondary | ICD-10-CM | POA: Diagnosis not present

## 2023-01-09 DIAGNOSIS — B954 Other streptococcus as the cause of diseases classified elsewhere: Secondary | ICD-10-CM | POA: Diagnosis present

## 2023-01-09 DIAGNOSIS — L03114 Cellulitis of left upper limb: Secondary | ICD-10-CM | POA: Diagnosis present

## 2023-01-09 DIAGNOSIS — I82612 Acute embolism and thrombosis of superficial veins of left upper extremity: Secondary | ICD-10-CM | POA: Diagnosis present

## 2023-01-09 DIAGNOSIS — Z888 Allergy status to other drugs, medicaments and biological substances status: Secondary | ICD-10-CM | POA: Diagnosis not present

## 2023-01-09 DIAGNOSIS — Z79899 Other long term (current) drug therapy: Secondary | ICD-10-CM | POA: Diagnosis not present

## 2023-01-09 DIAGNOSIS — K219 Gastro-esophageal reflux disease without esophagitis: Secondary | ICD-10-CM | POA: Diagnosis present

## 2023-01-09 DIAGNOSIS — L02414 Cutaneous abscess of left upper limb: Secondary | ICD-10-CM | POA: Diagnosis present

## 2023-01-09 HISTORY — PX: I & D EXTREMITY: SHX5045

## 2023-01-09 LAB — SURGICAL PCR SCREEN
MRSA, PCR: NEGATIVE
Staphylococcus aureus: POSITIVE — AB

## 2023-01-09 SURGERY — IRRIGATION AND DEBRIDEMENT EXTREMITY
Anesthesia: General | Laterality: Left

## 2023-01-09 MED ORDER — DROPERIDOL 2.5 MG/ML IJ SOLN: 0.6250 mg | Freq: Once | INTRAMUSCULAR | Status: DC | PRN

## 2023-01-09 MED ORDER — IOHEXOL 350 MG/ML SOLN
65.0000 mL | Freq: Once | INTRAVENOUS | Status: AC | PRN
  Administered 2023-01-09: 65 mL via INTRAVENOUS

## 2023-01-09 MED ORDER — HYDROMORPHONE HCL 1 MG/ML IJ SOLN
INTRAMUSCULAR | Status: AC
  Filled 2023-01-09: qty 1

## 2023-01-09 MED ORDER — SODIUM CHLORIDE 0.9 % IV SOLN
1.0000 g | INTRAVENOUS | Status: DC
  Administered 2023-01-09 – 2023-01-10 (×2): 1 g via INTRAVENOUS
  Filled 2023-01-09 (×2): qty 10

## 2023-01-09 MED ORDER — ACETAMINOPHEN 10 MG/ML IV SOLN
INTRAVENOUS | Status: AC
  Filled 2023-01-09: qty 100

## 2023-01-09 MED ORDER — FENTANYL CITRATE (PF) 250 MCG/5ML IJ SOLN
INTRAMUSCULAR | Status: DC | PRN
  Administered 2023-01-09: 100 ug via INTRAVENOUS

## 2023-01-09 MED ORDER — SODIUM CHLORIDE 0.9 % IR SOLN
Status: DC | PRN
  Administered 2023-01-09: 1000 mL

## 2023-01-09 MED ORDER — PROMETHAZINE HCL 25 MG/ML IJ SOLN: 6.2500 mg | INTRAMUSCULAR | Status: DC | PRN

## 2023-01-09 MED ORDER — PROPOFOL 10 MG/ML IV BOLUS
INTRAVENOUS | Status: DC | PRN
  Administered 2023-01-09: 200 mg via INTRAVENOUS

## 2023-01-09 MED ORDER — OXYCODONE HCL 5 MG PO TABS: 10.0000 mg | ORAL_TABLET | ORAL | Status: DC | PRN

## 2023-01-09 MED ORDER — CHLORHEXIDINE GLUCONATE 0.12 % MT SOLN: 15.0000 mL | Freq: Once | OROMUCOSAL | Status: AC

## 2023-01-09 MED ORDER — BUPIVACAINE HCL (PF) 0.25 % IJ SOLN
INTRAMUSCULAR | Status: AC
  Filled 2023-01-09: qty 30

## 2023-01-09 MED ORDER — VANCOMYCIN HCL 500 MG IV SOLR
INTRAVENOUS | Status: AC
  Filled 2023-01-09: qty 10

## 2023-01-09 MED ORDER — DEXAMETHASONE SODIUM PHOSPHATE 10 MG/ML IJ SOLN
8.0000 mg | Freq: Once | INTRAMUSCULAR | Status: AC
  Administered 2023-01-09: 8 mg via INTRAVENOUS
  Filled 2023-01-09: qty 1

## 2023-01-09 MED ORDER — CEFAZOLIN SODIUM 1 G IJ SOLR
INTRAMUSCULAR | Status: AC
  Filled 2023-01-09: qty 20

## 2023-01-09 MED ORDER — 0.9 % SODIUM CHLORIDE (POUR BTL) OPTIME
TOPICAL | Status: DC | PRN
  Administered 2023-01-09: 1000 mL

## 2023-01-09 MED ORDER — LIDOCAINE 2% (20 MG/ML) 5 ML SYRINGE
INTRAMUSCULAR | Status: DC | PRN
  Administered 2023-01-09: 100 mg via INTRAVENOUS

## 2023-01-09 MED ORDER — MUPIROCIN 2 % EX OINT
TOPICAL_OINTMENT | Freq: Two times a day (BID) | CUTANEOUS | Status: DC
  Filled 2023-01-09 (×2): qty 22

## 2023-01-09 MED ORDER — HYDROMORPHONE HCL 1 MG/ML IJ SOLN
0.2500 mg | INTRAMUSCULAR | Status: DC | PRN
  Administered 2023-01-09 (×4): 0.5 mg via INTRAVENOUS

## 2023-01-09 MED ORDER — ACETAMINOPHEN 10 MG/ML IV SOLN
INTRAVENOUS | Status: DC | PRN
Start: 2023-01-09 — End: 2023-01-09
  Administered 2023-01-09: 1000 mg via INTRAVENOUS

## 2023-01-09 MED ORDER — LACTATED RINGERS IV SOLN: INTRAVENOUS | Status: DC

## 2023-01-09 MED ORDER — CHLORHEXIDINE GLUCONATE 0.12 % MT SOLN
OROMUCOSAL | Status: AC
  Administered 2023-01-09: 15 mL via OROMUCOSAL
  Filled 2023-01-09: qty 15

## 2023-01-09 MED ORDER — ONDANSETRON HCL 4 MG/2ML IJ SOLN
INTRAMUSCULAR | Status: AC
  Filled 2023-01-09: qty 2

## 2023-01-09 MED ORDER — ORAL CARE MOUTH RINSE: 15.0000 mL | Freq: Once | OROMUCOSAL | Status: AC

## 2023-01-09 MED ORDER — VANCOMYCIN HCL 500 MG IV SOLR
INTRAVENOUS | Status: DC | PRN
Start: 2023-01-09 — End: 2023-01-09
  Administered 2023-01-09: 500 mg

## 2023-01-09 MED ORDER — CEFAZOLIN SODIUM-DEXTROSE 1-4 GM/50ML-% IV SOLN
INTRAVENOUS | Status: DC | PRN
Start: 2023-01-09 — End: 2023-01-09
  Administered 2023-01-09: 2 g via INTRAVENOUS

## 2023-01-09 MED ORDER — MIDAZOLAM HCL 2 MG/2ML IJ SOLN
INTRAMUSCULAR | Status: AC
  Filled 2023-01-09: qty 2

## 2023-01-09 MED ORDER — VANCOMYCIN HCL 1250 MG/250ML IV SOLN
1250.0000 mg | Freq: Two times a day (BID) | INTRAVENOUS | Status: DC
  Administered 2023-01-09 – 2023-01-10 (×3): 1250 mg via INTRAVENOUS
  Filled 2023-01-09 (×4): qty 250

## 2023-01-09 MED ORDER — POVIDONE-IODINE 10 % EX SWAB
2.0000 | Freq: Once | CUTANEOUS | Status: AC
  Administered 2023-01-09: 2 via TOPICAL

## 2023-01-09 MED ORDER — FENTANYL CITRATE (PF) 250 MCG/5ML IJ SOLN
INTRAMUSCULAR | Status: AC
  Filled 2023-01-09: qty 5

## 2023-01-09 MED ORDER — DIPHENHYDRAMINE HCL 25 MG PO CAPS: 25.0000 mg | ORAL_CAPSULE | Freq: Four times a day (QID) | ORAL | Status: DC | PRN

## 2023-01-09 SURGICAL SUPPLY — 65 items
BAG COUNTER SPONGE SURGICOUNT (BAG) ×1 IMPLANT
BAG SPNG CNTER NS LX DISP (BAG) ×1
BANDAGE ESMARK 6X9 LF (GAUZE/BANDAGES/DRESSINGS) IMPLANT
BLADE SURG 10 STRL SS (BLADE) ×1 IMPLANT
BNDG CMPR 5X4 KNIT ELC UNQ LF (GAUZE/BANDAGES/DRESSINGS) ×1
BNDG CMPR 9X4 STRL LF SNTH (GAUZE/BANDAGES/DRESSINGS)
BNDG CMPR 9X6 STRL LF SNTH (GAUZE/BANDAGES/DRESSINGS)
BNDG COHESIVE 4X5 TAN STRL (GAUZE/BANDAGES/DRESSINGS) ×1 IMPLANT
BNDG ELASTIC 4INX 5YD STR LF (GAUZE/BANDAGES/DRESSINGS) IMPLANT
BNDG ELASTIC 4X5.8 VLCR STR LF (GAUZE/BANDAGES/DRESSINGS) ×1 IMPLANT
BNDG ELASTIC 6X5.8 VLCR STR LF (GAUZE/BANDAGES/DRESSINGS) ×1 IMPLANT
BNDG ESMARK 4X9 LF (GAUZE/BANDAGES/DRESSINGS) IMPLANT
BNDG ESMARK 6X9 LF (GAUZE/BANDAGES/DRESSINGS)
BNDG GAUZE DERMACEA FLUFF 4 (GAUZE/BANDAGES/DRESSINGS) ×1 IMPLANT
BNDG GZE DERMACEA 4 6PLY (GAUZE/BANDAGES/DRESSINGS) ×1
CNTNR URN SCR LID CUP LEK RST (MISCELLANEOUS) IMPLANT
CONT SPEC 4OZ STRL OR WHT (MISCELLANEOUS)
COVER SURGICAL LIGHT HANDLE (MISCELLANEOUS) ×1 IMPLANT
CUFF TOURN SGL LL 12 NO SLV (MISCELLANEOUS) IMPLANT
CUFF TOURN SGL QUICK 34 (TOURNIQUET CUFF)
CUFF TRNQT CYL 34X4.125X (TOURNIQUET CUFF) IMPLANT
DRAPE SURG 17X23 STRL (DRAPES) IMPLANT
DRAPE U-SHAPE 47X51 STRL (DRAPES) IMPLANT
DRSG EMULSION OIL 3X3 NADH (GAUZE/BANDAGES/DRESSINGS) IMPLANT
DURAPREP 26ML APPLICATOR (WOUND CARE) ×1 IMPLANT
ELECT REM PT RETURN 9FT ADLT (ELECTROSURGICAL)
ELECTRODE REM PT RTRN 9FT ADLT (ELECTROSURGICAL) IMPLANT
EVACUATOR 1/8 PVC DRAIN (DRAIN) IMPLANT
FACESHIELD WRAPAROUND (MASK) ×1 IMPLANT
FACESHIELD WRAPAROUND OR TEAM (MASK) ×1 IMPLANT
GAUZE PAD ABD 8X10 STRL (GAUZE/BANDAGES/DRESSINGS) ×1 IMPLANT
GAUZE SPONGE 4X4 12PLY STRL (GAUZE/BANDAGES/DRESSINGS) ×1 IMPLANT
GAUZE XEROFORM 1X8 LF (GAUZE/BANDAGES/DRESSINGS) ×1 IMPLANT
GLOVE BIO SURGEON STRL SZ7.5 (GLOVE) ×1 IMPLANT
GLOVE BIOGEL PI IND STRL 7.5 (GLOVE) ×1 IMPLANT
GLOVE BIOGEL PI IND STRL 8 (GLOVE) ×1 IMPLANT
GLOVE SURG SYN 7.5 E (GLOVE) ×1 IMPLANT
GLOVE SURG SYN 7.5 PF PI (GLOVE) ×1 IMPLANT
GOWN STRL REUS W/ TWL LRG LVL3 (GOWN DISPOSABLE) ×2 IMPLANT
GOWN STRL REUS W/ TWL XL LVL3 (GOWN DISPOSABLE) ×2 IMPLANT
GOWN STRL REUS W/TWL LRG LVL3 (GOWN DISPOSABLE) ×2
GOWN STRL REUS W/TWL XL LVL3 (GOWN DISPOSABLE) ×2
HANDPIECE INTERPULSE COAX TIP (DISPOSABLE)
KIT BASIN OR (CUSTOM PROCEDURE TRAY) ×1 IMPLANT
KIT TURNOVER KIT B (KITS) ×1 IMPLANT
MANIFOLD NEPTUNE II (INSTRUMENTS) ×1 IMPLANT
NDL HYPO 25GX1X1/2 BEV (NEEDLE) IMPLANT
NEEDLE HYPO 25GX1X1/2 BEV (NEEDLE) IMPLANT
NS IRRIG 1000ML POUR BTL (IV SOLUTION) ×1 IMPLANT
PACK ORTHO EXTREMITY (CUSTOM PROCEDURE TRAY) ×1 IMPLANT
PAD ABD 8X10 STRL (GAUZE/BANDAGES/DRESSINGS) IMPLANT
PAD ARMBOARD 7.5X6 YLW CONV (MISCELLANEOUS) ×2 IMPLANT
SET CYSTO W/LG BORE CLAMP LF (SET/KITS/TRAYS/PACK) IMPLANT
SET HNDPC FAN SPRY TIP SCT (DISPOSABLE) IMPLANT
SPONGE T-LAP 18X18 ~~LOC~~+RFID (SPONGE) IMPLANT
STOCKINETTE IMPERVIOUS 9X36 MD (GAUZE/BANDAGES/DRESSINGS) ×1 IMPLANT
SUT ETHILON 3 0 PS 1 (SUTURE) IMPLANT
SUT PDS AB 2-0 CT1 27 (SUTURE) IMPLANT
SWAB CULTURE ESWAB REG 1ML (MISCELLANEOUS) IMPLANT
SYR CONTROL 10ML LL (SYRINGE) IMPLANT
TOWEL GREEN STERILE (TOWEL DISPOSABLE) ×1 IMPLANT
TOWEL GREEN STERILE FF (TOWEL DISPOSABLE) ×1 IMPLANT
TUBE CONNECTING 12X1/4 (SUCTIONS) ×1 IMPLANT
UNDERPAD 30X36 HEAVY ABSORB (UNDERPADS AND DIAPERS) ×1 IMPLANT
YANKAUER SUCT BULB TIP NO VENT (SUCTIONS) ×1 IMPLANT

## 2023-01-09 NOTE — Transfer of Care (Signed)
Immediate Anesthesia Transfer of Care Note  Patient: Ruben Navarro  Procedure(s) Performed: IRRIGATION AND DEBRIDEMENT LEFT ARM (Left)   Patient Location: PACU  Anesthesia Type:MAC  Level of Consciousness: awake, alert , oriented, and patient cooperative  Airway & Oxygen Therapy: Patient Spontanous Breathing and Patient connected to face mask oxygen  Post-op Assessment: Report given to RN, Post -op Vital signs reviewed and stable, and Patient moving all extremities X 4  Post vital signs: Reviewed and stable  Last Vitals:  Vitals   Taken Time  BP  01/09/23 1845  Temp  01/09/23 1840  Pulse  01/09/23 1854  Resp  01/09/23 1854  SpO2  01/09/23 1854  Vitals shown include unfiled device data.  Last Pain:  Vitals:   01/09/23 1853  TempSrc:   PainSc: 7       Patients Stated Pain Goal: 3 (01/09/23 1555)  Complications: No notable events documented.
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
## 2023-01-09 NOTE — Progress Notes (Signed)
    Subjective: Patient reports pain as marked. Feels like he is getting worse and t he swelling in increasing. Tolerating diet.  Urinating.   No CP, SOB.   Objective:   VITALS:   Vitals:   01/08/23 2053 01/09/23 0010 01/09/23 0437 01/09/23 0736  BP: 125/73 132/74 127/73 (!) 154/89  Pulse: (!) 55 (!) 53 (!) 51 (!) 55  Resp: 18 18 18 16   Temp: 98.3 F (36.8 C) 97.7 F (36.5 C) 98 F (36.7 C) 97.7 F (36.5 C)  TempSrc: Oral Oral Oral Oral  SpO2: 95% 96% 95% 98%  Weight:      Height:          Latest Ref Rng & Units 01/08/2023    8:12 AM 07/05/2010    2:50 PM  CBC  WBC 4.0 - 10.5 K/uL 8.1  9.1   Hemoglobin 13.0 - 17.0 g/dL 03.4  74.2   Hematocrit 39.0 - 52.0 % 47.7  48.8   Platelets 150 - 400 K/uL 230  255       Latest Ref Rng & Units 01/08/2023    8:12 AM 07/05/2010    2:50 PM  BMP  Glucose 70 - 99 mg/dL 96  76   BUN 6 - 20 mg/dL 10  7   Creatinine 5.95 - 1.24 mg/dL 6.38  7.56   Sodium 433 - 145 mmol/L 138  139   Potassium 3.5 - 5.1 mmol/L 4.0  4.6   Chloride 98 - 111 mmol/L 106  103   CO2 22 - 32 mmol/L 23  25   Calcium 8.9 - 10.3 mg/dL 9.2  9.7    Intake/Output      09/08 0701 09/09 0700 09/09 0701 09/10 0700   I.V. (mL/kg) 32.9 (0.3)    Total Intake(mL/kg) 32.9 (0.3)    Net +32.9            Physical Exam: General: NAD.  Walking around room, says his back hurts, calm Resp: No increased wob Cardio: regular rate and rhythm ABD soft Neurologically intact MSK Neurovascularly intact Sensation intact distally Intact pulses distally L elbow ROM intact but painful Incision: dressing C/D/I Worsening blanching erythema and induration below area of bedside I&D Iodoform gauze sticking out of incision   Assessment:    Principal Problem:   Cellulitis and abscess of upper extremity   Plan: Had placed him on the OR schedule for Tuesday but I'm concerned about the appearance of the wound today. Will order a CT elbow with contrast STAT. Making him NPO now. May  need to change case to emergent and do today after all.   Incentive Spirometry Elevate and Apply ice  Weightbearing: WBAT LUE Insicional and dressing care: Reinforce dressings as needed Pain control: PRN  Contact information:  Margarita Rana MD, [address] [place], [place] Office [phone] 01/09/2023, 8:41 AM

## 2023-01-09 NOTE — Anesthesia Preprocedure Evaluation (Addendum)
Anesthesia Evaluation  Patient identified by MRN, date of birth, ID band Patient awake    Reviewed: Allergy & Precautions, NPO status , Patient's Chart, lab work & pertinent test results  Airway Mallampati: II  TM Distance: >3 FB Neck ROM: Full    Dental  (+) Dental Advisory Given, Edentulous Upper, Edentulous Lower   Pulmonary Current Smoker and Patient abstained from smoking.   Pulmonary exam normal breath sounds clear to auscultation       Cardiovascular negative cardio ROS  Rhythm:Regular Rate:Normal     Neuro/Psych negative neurological ROS     GI/Hepatic ,GERD  ,,(+)     substance abuse  cocaine use and IV drug use  Endo/Other  negative endocrine ROS    Renal/GU negative Renal ROS     Musculoskeletal negative musculoskeletal ROS (+)    Abdominal   Peds  Hematology negative hematology ROS (+)   Anesthesia Other Findings   Reproductive/Obstetrics                             Anesthesia Physical Anesthesia Plan  ASA: 3  Anesthesia Plan: General   Post-op Pain Management: Ofirmev IV (intra-op)*   Induction: Intravenous  PONV Risk Score and Plan: 2 and Ondansetron, Dexamethasone, Treatment may vary due to age or medical condition and Midazolam  Airway Management Planned: LMA  Additional Equipment:   Intra-op Plan:   Post-operative Plan: Extubation in OR  Informed Consent: I have reviewed the patients History and Physical, chart, labs and discussed the procedure including the risks, benefits and alternatives for the proposed anesthesia with the patient or authorized representative who has indicated his/her understanding and acceptance.     Dental advisory given  Plan Discussed with: CRNA  Anesthesia Plan Comments:        Anesthesia Quick Evaluation

## 2023-01-09 NOTE — Progress Notes (Signed)
PROGRESS NOTE    Ruben Navarro  JXB:[id number] DOB: [birth year] DOA: 01/08/2023 PCP: Patient, No Pcp Per  Outpatient Specialists: none    Brief Narrative:   Ruben Navarro is a 57 y.o. male with medical history significant of no significant past medical history who developed cellulitis and abscess at the site of a missed intravenous injection of cocaine last week.  Approximately 1 week later and 2 days ago he developed pain and redness at the area then developed a large blister that became increasingly painful.  He had been using ibuprofen at home but the pain became so much worse.  He denies significant fever or chills at home.  He presented to [facility] on today where a bedside ultrasound revealed an abscess.  An I&D was performed at the site which drained a copious amount of purulent foul-smelling discharge.  There is still some erythema and induration at the site.  Hand surgery was consulted and will see him later today in case he needs a washout.  He reports pain was improved until the numbing started to wear off and he is gotten 1 dose of IV fentanyl.  That seems to be wearing off now and he is having increased pain.  The erythema was marked and has not increased outside of the edges.   Patient reports being clean of drugs for the last 5 years and this was a one-time lapse in judgment.   Assessment & Plan:   Principal Problem:   Cellulitis and abscess of upper extremity Active Problems:   Cocaine abuse (HCC)   # Abscess/cellulitis Left arm 2/2 IVDU. S/p I and D in ER on 9/8. Culture growing GPCs and GNRs - stop doxy and start vanc, add ceftriaxone (discussed w/ dr. Renold Don of ID) - for OR I and D later today with ortho - monitor blood and wound cultures  # IVDU Hiv neg - will check hbv, hcv, rpr   DVT prophylaxis: lovenox Code Status: full Family Communication: none at bedside  Level of care: Med-Surg Status is: Observation    Consultants:   ortho  Procedures: pending  Antimicrobials:  vancomycin    Subjective: Reports stable left arm pain  Objective: Vitals:   01/08/23 2053 01/09/23 0010 01/09/23 0437 01/09/23 0736  BP: 125/73 132/74 127/73 (!) 154/89  Pulse: (!) 55 (!) 53 (!) 51 (!) 55  Resp: 18 18 18 16   Temp: 98.3 F (36.8 C) 97.7 F (36.5 C) 98 F (36.7 C) 97.7 F (36.5 C)  TempSrc: Oral Oral Oral Oral  SpO2: 95% 96% 95% 98%  Weight:      Height:        Intake/Output Summary (Last 24 hours) at 01/09/2023 1033 Last data filed at 01/08/2023 1846 Gross per 24 hour  Intake 32.88 ml  Output --  Net 32.88 ml   Filed Weights   01/08/23 1257  Weight: 104.8 kg    Examination:  General exam: Appears calm and comfortable  Respiratory system: Clear to auscultation. Respiratory effort normal. Cardiovascular system: S1 & S2 heard, RRR. No JVD, murmurs, rubs, gallops or clicks. No pedal edema. Gastrointestinal system: Abdomen is nondistended, soft and nontender. No organomegaly or masses felt. Normal bowel sounds heard. Central nervous system: Alert and oriented. No focal neurological deficits. Extremities: Symmetric 5 x 5 power. Skin: left arm dressing in place Psychiatry: Judgement and insight appear normal. Mood & affect appropriate.     Data Reviewed: I have personally reviewed following labs and imaging studies  CBC: Recent  Labs  Lab 01/08/23 0812  WBC 8.1  NEUTROABS 5.5  HGB 16.4  HCT 47.7  MCV 92.6  PLT 230   Basic Metabolic Panel: Recent Labs  Lab 01/08/23 0812  NA 138  K 4.0  CL 106  CO2 23  GLUCOSE 96  BUN 10  CREATININE 0.75  CALCIUM 9.2   GFR: Estimated Creatinine Clearance: 129.1 mL/min (by C-G formula based on SCr of 0.75 mg/dL). Liver Function Tests: No results for input(s): "AST", "ALT", "ALKPHOS", "BILITOT", "PROT", "ALBUMIN" in the last 168 hours. No results for input(s): "LIPASE", "AMYLASE" in the last 168 hours. No results for input(s): "AMMONIA" in the last 168  hours. Coagulation Profile: No results for input(s): "INR", "PROTIME" in the last 168 hours. Cardiac Enzymes: No results for input(s): "CKTOTAL", "CKMB", "CKMBINDEX", "TROPONINI" in the last 168 hours. BNP (last 3 results) No results for input(s): "PROBNP" in the last 8760 hours. HbA1C: No results for input(s): "HGBA1C" in the last 72 hours. CBG: No results for input(s): "GLUCAP" in the last 168 hours. Lipid Profile: No results for input(s): "CHOL", "HDL", "LDLCALC", "TRIG", "CHOLHDL", "LDLDIRECT" in the last 72 hours. Thyroid Function Tests: No results for input(s): "TSH", "T4TOTAL", "FREET4", "T3FREE", "THYROIDAB" in the last 72 hours. Anemia Panel: No results for input(s): "VITAMINB12", "FOLATE", "FERRITIN", "TIBC", "IRON", "RETICCTPCT" in the last 72 hours. Urine analysis: No results found for: "COLORURINE", "APPEARANCEUR", "LABSPEC", "PHURINE", "GLUCOSEU", "HGBUR", "BILIRUBINUR", "KETONESUR", "PROTEINUR", "UROBILINOGEN", "NITRITE", "LEUKOCYTESUR" Sepsis Labs: @LABRCNTIP (procalcitonin:4,lacticidven:4)  ) Recent Results (from the past 240 hour(s))  Blood culture (routine x 2)     Status: None (Preliminary result)   Collection Time: 01/08/23  8:11 AM   Specimen: BLOOD  Result Value Ref Range Status   Specimen Description   Final    BLOOD RIGHT ANTECUBITAL Performed at [facility], [address] [place], [place] [address]    Special Requests   Final    BOTTLES DRAWN AEROBIC AND ANAEROBIC Blood Culture adequate volume Performed at [facility], [address], [place], [place] [address]    Culture   Final    NO GROWTH < 12 HOURS Performed at [facility], [address] [place], [place] [address]    Report Status PENDING  Incomplete  Aerobic Culture w Gram Stain (superficial specimen)     Status: None (Preliminary result)   Collection Time: 01/08/23  9:46 AM   Specimen: Abscess; Wound  Result Value Ref Range Status    Specimen Description   Final    ABSCESS S T Performed at [facility], [address], [place], [place] [address]    Special Requests   Final    NONE Performed at [facility], [address] [place], [place] [address]    Gram Stain   Final    FEW WBC PRESENT, PREDOMINANTLY PMN FEW GRAM POSITIVE COCCI FEW GRAM NEGATIVE RODS    Culture   Final    NO GROWTH < 12 HOURS Performed at [facility], [address] [place], [place], [place] [address]    Report Status PENDING  Incomplete         Radiology Studies: DG Elbow 2 Views Left  Result Date: 01/08/2023 CLINICAL DATA:  Left arm pain and swelling for 2 days. EXAM: LEFT ELBOW - 2 VIEW COMPARISON:  None Available. FINDINGS: There is no evidence of fracture, dislocation, or joint effusion. There is no evidence of arthropathy or other focal bone abnormality. Soft tissues are unremarkable. IMPRESSION: Negative. Electronically Signed  By: Lupita Raider M.D.   On: 01/08/2023 10:40        Scheduled Meds:  acetaminophen  1,000 mg Oral Q6H   doxycycline  100 mg Oral Q12H   enoxaparin (LOVENOX) injection  40 mg Subcutaneous Q24H   ibuprofen  600 mg Oral Q6H   nicotine  21 mg Transdermal Once   nicotine  21 mg Transdermal Daily   Continuous Infusions:  vancomycin       LOS: 0 days     Silvano Bilis, MD [facility]   If 7PM-7AM, please contact night-coverage [email or web address] Password [facility] 01/09/2023, 10:33 AM

## 2023-01-09 NOTE — H&P (View-Only) (Signed)
    Subjective: Patient reports pain as marked. Feels like he is getting worse and t he swelling in increasing. Tolerating diet.  Urinating.   No CP, SOB.   Objective:   VITALS:   Vitals:   01/08/23 2053 01/09/23 0010 01/09/23 0437 01/09/23 0736  BP: 125/73 132/74 127/73 (!) 154/89  Pulse: (!) 55 (!) 53 (!) 51 (!) 55  Resp: 18 18 18 16   Temp: 98.3 F (36.8 C) 97.7 F (36.5 C) 98 F (36.7 C) 97.7 F (36.5 C)  TempSrc: Oral Oral Oral Oral  SpO2: 95% 96% 95% 98%  Weight:      Height:          Latest Ref Rng & Units 01/08/2023    8:12 AM 07/05/2010    2:50 PM  CBC  WBC 4.0 - 10.5 K/uL 8.1  9.1   Hemoglobin 13.0 - 17.0 g/dL 03.4  74.2   Hematocrit 39.0 - 52.0 % 47.7  48.8   Platelets 150 - 400 K/uL 230  255       Latest Ref Rng & Units 01/08/2023    8:12 AM 07/05/2010    2:50 PM  BMP  Glucose 70 - 99 mg/dL 96  76   BUN 6 - 20 mg/dL 10  7   Creatinine 5.95 - 1.24 mg/dL 6.38  7.56   Sodium 433 - 145 mmol/L 138  139   Potassium 3.5 - 5.1 mmol/L 4.0  4.6   Chloride 98 - 111 mmol/L 106  103   CO2 22 - 32 mmol/L 23  25   Calcium 8.9 - 10.3 mg/dL 9.2  9.7    Intake/Output      09/08 0701 09/09 0700 09/09 0701 09/10 0700   I.V. (mL/kg) 32.9 (0.3)    Total Intake(mL/kg) 32.9 (0.3)    Net +32.9            Physical Exam: General: NAD.  Walking around room, says his back hurts, calm Resp: No increased wob Cardio: regular rate and rhythm ABD soft Neurologically intact MSK Neurovascularly intact Sensation intact distally Intact pulses distally L elbow ROM intact but painful Incision: dressing C/D/I Worsening blanching erythema and induration below area of bedside I&D Iodoform gauze sticking out of incision   Assessment:    Principal Problem:   Cellulitis and abscess of upper extremity   Plan: Had placed him on the OR schedule for Tuesday but I'm concerned about the appearance of the wound today. Will order a CT elbow with contrast STAT. Making him NPO now. May  need to change case to emergent and do today after all.   Incentive Spirometry Elevate and Apply ice  Weightbearing: WBAT LUE Insicional and dressing care: Reinforce dressings as needed Pain control: PRN  Contact information:  Margarita Rana MD, [address] [place], [place] Office [phone] 01/09/2023, 8:41 AM

## 2023-01-09 NOTE — Op Note (Signed)
01/08/2023 - 01/09/2023  5:31 PM  PATIENT:  Ruben Navarro    PRE-OPERATIVE DIAGNOSIS:  left antecubital fossa  POST-OPERATIVE DIAGNOSIS:  Same  PROCEDURE:  IRRIGATION AND DEBRIDEMENT LEFT ARM  SURGEON:  Sheral Apley, MD  ASSISTANT: Levester Fresh, PA-C, he was present and scrubbed throughout the case, critical for completion in a timely fashion, and for retraction, instrumentation, and closure.   ANESTHESIA:   gen  PREOPERATIVE INDICATIONS:  Ruben Navarro is a  57 y.o. male with a diagnosis of left antecubital fossa who failed conservative measures and elected for surgical management.    The risks benefits and alternatives were discussed with the patient preoperatively including but not limited to the risks of infection, bleeding, nerve injury, cardiopulmonary complications, the need for revision surgery, among others, and the patient was willing to proceed.  OPERATIVE IMPLANTS: none  OPERATIVE FINDINGS: purulent fluid  BLOOD LOSS: min  COMPLICATIONS: none  TOURNIQUET TIME: none  OPERATIVE PROCEDURE:  Patient was identified in the preoperative holding area and site was marked by me He was transported to the operating theater and placed on the table in supine position taking care to pad all bony prominences. After a preincinduction time out anesthesia was induced. The left upper extremity was prepped and draped in normal sterile fashion and a pre-incision timeout was performed. He received ancef for preoperative antibiotics.   I made an incisoin over his forearm abscess site.   I was able to express purulent fluid.   I performed a debridement of the deep space with a pick up and scissors of devitalized tissue.   I Probed for any additional fluid.   I irrigated with saline.  I performed a complex closure.   POST OPERATIVE PLAN: continue abx. Mobilize as able.

## 2023-01-09 NOTE — Interval H&P Note (Signed)
History and Physical Interval Note:  01/09/2023 5:01 PM  Ruben Navarro  has presented today for surgery, with the diagnosis of left antecubital fossa.  The various methods of treatment have been discussed with the patient and family. After consideration of risks, benefits and other options for treatment, the patient has consented to  Procedure(s): IRRIGATION AND DEBRIDEMENT LEFT ARM (Left) as a surgical intervention.  The patient's history has been reviewed, patient examined, no change in status, stable for surgery.  I have reviewed the patient's chart and labs.  Questions were answered to the patient's satisfaction.     Sheral Apley

## 2023-01-09 NOTE — Progress Notes (Addendum)
Pt reports he consumed his breakfast tray at 9am today consisting of an omelet, 2 pieces of bacon, sausage, breakfast potatoes. OR desk made aware, Dr. Sampson Goon notified.  Addendum: Pt endorses daily marijuana use and weekly cocaine use. Reports last cocaine use Friday 01/06/23 afternoon.  Pt doesn't want mother to know about drug use, but is okay to generally update mother regarding surgery. (Pt states he lied and told mother that he was in hospital with a rusty nail injury)

## 2023-01-09 NOTE — Anesthesia Procedure Notes (Signed)
Procedure Name: LMA Insertion Date/Time: 01/09/2023 5:45 PM  Performed by: Thomasene Ripple, CRNAPre-anesthesia Checklist: Patient identified, Emergency Drugs available, Suction available and Patient being monitored Patient Re-evaluated:Patient Re-evaluated prior to induction Oxygen Delivery Method: Circle System Utilized Preoxygenation: Pre-oxygenation with 100% oxygen Induction Type: IV induction Ventilation: Mask ventilation without difficulty LMA: LMA inserted LMA Size: 5.0 Number of attempts: 1 Airway Equipment and Method: Bite block Placement Confirmation: positive ETCO2 Tube secured with: Tape Dental Injury: Teeth and Oropharynx as per pre-operative assessment

## 2023-01-10 ENCOUNTER — Encounter (HOSPITAL_COMMUNITY): Payer: Self-pay | Admitting: Orthopedic Surgery

## 2023-01-10 ENCOUNTER — Encounter (HOSPITAL_COMMUNITY): Payer: BC Managed Care – PPO

## 2023-01-10 DIAGNOSIS — L03119 Cellulitis of unspecified part of limb: Secondary | ICD-10-CM | POA: Diagnosis not present

## 2023-01-10 DIAGNOSIS — L02419 Cutaneous abscess of limb, unspecified: Secondary | ICD-10-CM

## 2023-01-10 LAB — HEPATITIS B SURFACE ANTIGEN: Hepatitis B Surface Ag: NONREACTIVE

## 2023-01-10 LAB — RPR: RPR Ser Ql: NONREACTIVE

## 2023-01-10 MED ORDER — SULFAMETHOXAZOLE-TRIMETHOPRIM 800-160 MG PO TABS: 1.0000 | ORAL_TABLET | Freq: Two times a day (BID) | ORAL | 0 refills | Status: AC

## 2023-01-10 NOTE — Plan of Care (Signed)
  Problem: Health Behavior/Discharge Planning: Goal: Ability to manage health-related needs will improve Outcome: Progressing   

## 2023-01-10 NOTE — Discharge Summary (Signed)
Physician Discharge Summary  Khalon Beatley HCW:[id number] DOB: [birth year] DOA: 01/08/2023  PCP: Patient, No Pcp Per  Admit date: 01/08/2023 Discharge date: 01/10/2023  Admitted From: Home Disposition: Home  Recommendations for Outpatient Follow-up:  Follow up with PCP in 1-2 weeks Go to orthopedics office on 9/16 as instructed   Discharge Condition: Stable CODE STATUS: Full code Diet recommendation: Regular diet  Discharge summary: Dshaun Deets is a 57 y.o. 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  Approximately 1 week later and 2 days ago he developed pain and redness at the area then developed a large blister that became increasingly painful.  He had been using ibuprofen at home but the pain became so much worse.  He denies significant fever or chills at home.  He presented to [facility] ER where a bedside ultrasound revealed an abscess.  An I&D was performed at the site which drained a copious amount of purulent foul-smelling discharge.  There was still some erythema and induration at the site. Patient was sent to the hospital to undergo I&D under anesthesia.  Underwent surgical incision and drainage 9/9 as well wound closure. Blood cultures negative.  Surgical culture with mixed gram-positive and gram-negative rods.  MRSA swab positive in his nares. Patient reports being clean of drugs for the last 5 years and this was a one-time lapse in judgment.   # Abscess/cellulitis Left arm 2/2 IVDU. S/p I and D in ER on 9/8. Culture growing GPCs and GNRs -Treated with vancomycin and ceftriaxone. -Status post I&D by orthopedics. Blood cultures negative so far.  Local cultures with GPC's and GNR's.  MRSA positive in his nares.  Clinically improving. Primary wound closure done. With clinical stability, discharging home with oral Bactrim for 7 additional days.  Pain management with  NSAIDs.  Patient to keep dressing intact and dry and follow-up with orthopedics.   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  Counseled to quit.   # Basilic vein thrombosis: As seen in CT scan.  This is due to inflammation.   Upper extremity venous thrombosis that is distal.  This is likely provoked DVT if any from local abscess.  Treatment with anticoagulation is not indicated, risks of anticoagulation outweigh any benefits.  Patient was advised to monitor for increasing pain or swelling.  Stable for discharge.    Discharge Diagnoses:  Principal Problem:   Cellulitis and abscess of upper extremity Active Problems:   Cocaine abuse [facility])    Discharge Instructions  Discharge Instructions     Call MD for:  redness, tenderness, or signs of infection (pain, swelling, redness, odor or green/yellow discharge around incision site)   Complete by: As directed    Call MD for:  severe uncontrolled pain   Complete by: As directed    Diet general   Complete by: As directed    Increase activity slowly   Complete by: As directed    Leave dressing on - Keep it clean, dry, and intact until clinic visit   Complete by: As directed       Allergies as of 01/10/2023       Reactions   Bupropion Nausea And Vomiting        Medication List     STOP taking these medications    naproxen sodium 220 MG tablet Commonly known as: ALEVE       TAKE these medications    ibuprofen 200  MG tablet Commonly known as: ADVIL Take 200-800 mg by mouth as needed for moderate pain.   omeprazole 20 MG capsule Commonly known as: PRILOSEC Take 20 mg by mouth daily.   sulfamethoxazole-trimethoprim 800-160 MG tablet Commonly known as: BACTRIM DS Take 1 tablet by mouth 2 (two) times daily for 7 days.               Discharge Care Instructions  (From admission, onward)           Start     Ordered   01/10/23 0000  Leave dressing on - Keep it clean, dry, and intact until clinic visit         01/10/23 1549            Follow-up Information     Sheral Apley, MD. Schedule an appointment as soon as possible for a visit on 01/16/2023.   Specialty: Orthopedic Surgery Contact information: [address] [place] [address] [phone]                Allergies  Allergen Reactions   Bupropion Nausea And Vomiting    Consultations: Orthopedics   Procedures/Studies: CT ELBOW LEFT W CONTRAST  Result Date: 01/09/2023 CLINICAL DATA:  Concern for soft tissue infection and abscess. EXAM: CT OF THE UPPER LEFT EXTREMITY WITH CONTRAST TECHNIQUE: Multidetector CT imaging of the left elbow was performed according to the standard protocol following intravenous contrast administration. RADIATION DOSE REDUCTION: This exam was performed according to the departmental dose-optimization program which includes automated exposure control, adjustment of the mA and/or kV according to patient size and/or use of iterative reconstruction technique. CONTRAST:  65mL OMNIPAQUE IOHEXOL 350 MG/ML SOLN COMPARISON:  Left elbow radiograph dated 01/08/2023. FINDINGS: Bones/Joint/Cartilage No acute fracture or dislocation. No significant arthritic changes. No joint effusion. Ligaments Suboptimally assessed by CT. Muscles and Tendons No acute findings.  No intramuscular fluid collection. Soft tissues There is focal area of skin defect and laceration with edema and thickening of the skin in the medial aspect of the anterior elbow. Small pocket of air noted within the skin at the site of the defect. There is focal stranding and edema of the subcutaneous soft tissues. No drainable fluid collection or abscess noted. There is focal area of nonopacification of the basilic vein adjacent to the inflammatory area which may be due to spasm. A thrombus is not excluded. Ultrasound may provide better evaluation if clinically gated. IMPRESSION: 1. Focal area of skin defect and thickening in the  medial aspect of the anterior elbow. No drainable fluid collection or abscess. 2. Focal area of nonopacification of the basilic vein adjacent to the inflammatory area may be due to spasm. A thrombus is not excluded. Ultrasound may provide better evaluation if clinically gated. Electronically Signed   By: Elgie Collard M.D.   On: 01/09/2023 17:17   DG Elbow 2 Views Left  Result Date: 01/08/2023 CLINICAL DATA:  Left arm pain and swelling for 2 days. EXAM: LEFT ELBOW - 2 VIEW COMPARISON:  None Available. FINDINGS: There is no evidence of fracture, dislocation, or joint effusion. There is no evidence of arthropathy or other focal bone abnormality. Soft tissues are unremarkable. IMPRESSION: Negative. Electronically Signed   By: Lupita Raider M.D.   On: 01/08/2023 10:40   (Echo, Carotid, EGD, Colonoscopy, ERCP)    Subjective: Patient seen and examined in the morning rounds.  Denies any complaints.  Wants to go home.  He is moving his elbow  and it is pain-free.   Discharge Exam: Vitals:   01/10/23 0556 01/10/23 0809  BP: (!) 112/56 98/64  Pulse: (!) 49 (!) 53  Resp: 20 18  Temp: (!) 97.5 F (36.4 C) 97.8 F (36.6 C)  SpO2: 96% 95%   Vitals:   01/09/23 2037 01/10/23 0340 01/10/23 0556 01/10/23 0809  BP: 123/75 (!) 131/92 (!) 112/56 98/64  Pulse: (!) 53 (!) 58 (!) 49 (!) 53  Resp: 18 19 20 18   Temp: 97.7 F (36.5 C)  (!) 97.5 F (36.4 C) 97.8 F (36.6 C)  TempSrc: Oral     SpO2: 98% 97% 96% 95%  Weight:      Height:        General: Pt is alert, awake, not in acute distress Cardiovascular: RRR, S1/S2 +, no rubs, no gallops Respiratory: CTA bilaterally, no wheezing, no rhonchi Abdominal: Soft, NT, ND, bowel sounds + Extremities: no edema, no cyanosis, left arm on postsurgical dressing.  Distal neurovascular status intact.    The results of significant diagnostics from this hospitalization (including imaging, microbiology, ancillary and laboratory) are listed below for reference.      Microbiology: Recent Results (from the past 240 hour(s))  Blood culture (routine x 2)     Status: None (Preliminary result)   Collection Time: 01/08/23  8:11 AM   Specimen: BLOOD  Result Value Ref Range Status   Specimen Description   Final    BLOOD RIGHT ANTECUBITAL Performed at [facility], [address] [place], [place] [address]    Special Requests   Final    BOTTLES DRAWN AEROBIC AND ANAEROBIC Blood Culture adequate volume Performed at [facility], [address], [place], [place] [address]    Culture   Final    NO GROWTH 2 DAYS Performed at [facility], [address] [place], [place] [address]    Report Status PENDING  Incomplete  Aerobic Culture w Gram Stain (superficial specimen)     Status: None (Preliminary result)   Collection Time: 01/08/23  9:46 AM   Specimen: Abscess; Wound  Result Value Ref Range Status   Specimen Description   Final    ABSCESS S T Performed at [facility], [address] [place], [place] [address]    Special Requests   Final    NONE Performed at [facility], [address] [place], [place] [address]    Gram Stain   Final    FEW WBC PRESENT, PREDOMINANTLY PMN FEW GRAM POSITIVE COCCI FEW GRAM NEGATIVE RODS    Culture   Final    FEW STREPTOCOCCUS GROUP C Beta hemolytic streptococci are predictably susceptible to penicillin and other beta lactams. Susceptibility testing not routinely performed. CULTURE REINCUBATED FOR BETTER GROWTH Performed at [facility], [address], [place], [place] [address]    Report Status PENDING  Incomplete  Surgical pcr screen     Status: Abnormal   Collection Time: 01/09/23  4:00 PM   Specimen: Nasal Mucosa; Nasal Swab  Result Value Ref Range Status   MRSA, PCR NEGATIVE NEGATIVE Final   Staphylococcus aureus POSITIVE (A) NEGATIVE Final    Comment: (NOTE) The Xpert SA Assay (FDA approved for NASAL  specimens in patients 68 years of age and older), is one component of a comprehensive surveillance program. It is not intended to diagnose infection nor to guide or monitor treatment. Performed at [facility], [address], [place], [place] [address]  Labs: BNP (last 3 results) No results for input(s): "BNP" in the last 8760 hours. Basic Metabolic Panel: Recent Labs  Lab 01/08/23 0812  NA 138  K 4.0  CL 106  CO2 23  GLUCOSE 96  BUN 10  CREATININE 0.75  CALCIUM 9.2   Liver Function Tests: No results for input(s): "AST", "ALT", "ALKPHOS", "BILITOT", "PROT", "ALBUMIN" in the last 168 hours. No results for input(s): "LIPASE", "AMYLASE" in the last 168 hours. No results for input(s): "AMMONIA" in the last 168 hours. CBC: Recent Labs  Lab 01/08/23 0812  WBC 8.1  NEUTROABS 5.5  HGB 16.4  HCT 47.7  MCV 92.6  PLT 230   Cardiac Enzymes: No results for input(s): "CKTOTAL", "CKMB", "CKMBINDEX", "TROPONINI" in the last 168 hours. BNP: Invalid input(s): "POCBNP" CBG: No results for input(s): "GLUCAP" in the last 168 hours. D-Dimer No results for input(s): "DDIMER" in the last 72 hours. Hgb A1c No results for input(s): "HGBA1C" in the last 72 hours. Lipid Profile No results for input(s): "CHOL", "HDL", "LDLCALC", "TRIG", "CHOLHDL", "LDLDIRECT" in the last 72 hours. Thyroid function studies No results for input(s): "TSH", "T4TOTAL", "T3FREE", "THYROIDAB" in the last 72 hours.  Invalid input(s): "FREET3" Anemia work up No results for input(s): "VITAMINB12", "FOLATE", "FERRITIN", "TIBC", "IRON", "RETICCTPCT" in the last 72 hours. Urinalysis No results found for: "COLORURINE", "APPEARANCEUR", "LABSPEC", "PHURINE", "GLUCOSEU", "HGBUR", "BILIRUBINUR", "KETONESUR", "PROTEINUR", "UROBILINOGEN", "NITRITE", "LEUKOCYTESUR" Sepsis Labs Recent Labs  Lab 01/08/23 0812  WBC 8.1   Microbiology Recent Results (from the past 240 hour(s))  Blood culture (routine x 2)      Status: None (Preliminary result)   Collection Time: 01/08/23  8:11 AM   Specimen: BLOOD  Result Value Ref Range Status   Specimen Description   Final    BLOOD RIGHT ANTECUBITAL Performed at [facility], [address] [place], [place] [address]    Special Requests   Final    BOTTLES DRAWN AEROBIC AND ANAEROBIC Blood Culture adequate volume Performed at [facility], [address] [place], [place] [address]    Culture   Final    NO GROWTH 2 DAYS Performed at [facility], [address], [place], [place] [address]    Report Status PENDING  Incomplete  Aerobic Culture w Gram Stain (superficial specimen)     Status: None (Preliminary result)   Collection Time: 01/08/23  9:46 AM   Specimen: Abscess; Wound  Result Value Ref Range Status   Specimen Description   Final    ABSCESS S T Performed at [facility], [address] [place], [place] [address]    Special Requests   Final    NONE Performed at [facility], [address] [place], [place] [address]    Gram Stain   Final    FEW WBC PRESENT, PREDOMINANTLY PMN FEW GRAM POSITIVE COCCI FEW GRAM NEGATIVE RODS    Culture   Final    FEW STREPTOCOCCUS GROUP C Beta hemolytic streptococci are predictably susceptible to penicillin and other beta lactams. Susceptibility testing not routinely performed. CULTURE REINCUBATED FOR BETTER GROWTH Performed at [facility], [address] [place], [place] [address]    Report Status PENDING  Incomplete  Surgical pcr screen     Status: Abnormal   Collection Time: 01/09/23  4:00 PM   Specimen: Nasal Mucosa; Nasal Swab  Result Value Ref Range Status   MRSA, PCR NEGATIVE NEGATIVE Final   Staphylococcus aureus POSITIVE (A) NEGATIVE Final  Comment: (NOTE) The Xpert SA Assay (FDA approved for NASAL specimens in patients 56 years of age and older), is one component of a comprehensive surveillance  program. It is not intended to diagnose infection nor to guide or monitor treatment. Performed at [facility], [address], [place], [place] [address]      Time coordinating discharge: 32 minutes  SIGNED:   Dorcas Carrow, MD  [facility] 01/10/2023, 3:50 PM

## 2023-01-10 NOTE — Progress Notes (Signed)
    Subjective: Patient reports pain as mild. Much better than before surgery. No longer needing narcotics for relief. Tolerating diet.  Urinating.   No CP, SOB.   Wound culture from bedside I&D in ER growing Strep  Objective:   VITALS:   Vitals:   01/09/23 2037 01/10/23 0340 01/10/23 0556 01/10/23 0809  BP: 123/75 (!) 131/92 (!) 112/56 98/64  Pulse: (!) 53 (!) 58 (!) 49 (!) 53  Resp: 18 19 20 18   Temp: 97.7 F (36.5 C)  (!) 97.5 F (36.4 C) 97.8 F (36.6 C)  TempSrc: Oral     SpO2: 98% 97% 96% 95%  Weight:      Height:          Latest Ref Rng & Units 01/08/2023    8:12 AM 07/05/2010    2:50 PM  CBC  WBC 4.0 - 10.5 K/uL 8.1  9.1   Hemoglobin 13.0 - 17.0 g/dL 19.1  47.8   Hematocrit 39.0 - 52.0 % 47.7  48.8   Platelets 150 - 400 K/uL 230  255       Latest Ref Rng & Units 01/08/2023    8:12 AM 07/05/2010    2:50 PM  BMP  Glucose 70 - 99 mg/dL 96  76   BUN 6 - 20 mg/dL 10  7   Creatinine 2.95 - 1.24 mg/dL 6.21  3.08   Sodium 657 - 145 mmol/L 138  139   Potassium 3.5 - 5.1 mmol/L 4.0  4.6   Chloride 98 - 111 mmol/L 106  103   CO2 22 - 32 mmol/L 23  25   Calcium 8.9 - 10.3 mg/dL 9.2  9.7    Intake/Output      09/09 0701 09/10 0700 09/10 0701 09/11 0700   P.O. 240    I.V. (mL/kg) 600 (5.7)    IV Piggyback 332.2    Total Intake(mL/kg) 1172.2 (11.2)    Blood 3    Total Output 3    Net +1169.2            Physical Exam: General: NAD.  Laying in bed, calm, comfortable Resp: No increased wob Cardio: regular rate and rhythm ABD soft Neurologically intact MSK Neurovascularly intact Sensation intact distally Intact pulses distally L elbow ROM intact Incision: dressing C/D/I Decreasing blanching erythema but induration still present. Likely the area of thrombus in the basilic vein   Assessment: 1 Day Post-Op  Principal Problem:   Cellulitis and abscess of upper extremity Active Problems:   Cocaine abuse (HCC)   Plan: Primary team feels thrombus seen on  CT is from inflammation and not an indication for DVT exam or prophylaxis.   Ok to discharge from ortho standpoint as long as he can be given a PO ABX to take for the Strep growing from the wound culture.   Discussed that he should leave the dressings in place. Needs to come to our office Monday 01/16/23 for wound check and suture removal.  Incentive Spirometry Elevate and Apply ice  Weightbearing: WBAT LUE Insicional and dressing care: Reinforce dressings as needed Pain control: PRN  Contact information:  Margarita Rana MD, [address] PA-C    Jenne Pane, [place] Office [phone] 01/10/2023, 9:49 AM

## 2023-01-10 NOTE — Anesthesia Postprocedure Evaluation (Signed)
Anesthesia Post Note  Patient: Ruben Navarro  Procedure(s) Performed: IRRIGATION AND DEBRIDEMENT LEFT ARM (Left)     Patient location during evaluation: PACU Anesthesia Type: General Level of consciousness: sedated and patient cooperative Pain management: pain level controlled Vital Signs Assessment: post-procedure vital signs reviewed and stable Respiratory status: spontaneous breathing Cardiovascular status: stable Anesthetic complications: no   No notable events documented.  Last Vitals:  Vitals:   01/10/23 0556 01/10/23 0809  BP: (!) 112/56 98/64  Pulse: (!) 49 (!) 53  Resp: 20 18  Temp: (!) 36.4 C 36.6 C  SpO2: 96% 95%    Last Pain:  Vitals:   01/10/23 0800  TempSrc:   PainSc: 0-No pain                 [name]

## 2023-01-10 NOTE — Progress Notes (Signed)
Patient discharged.  Tech removed PIV.  Reviewed discharge instructions, medications and follow up appts with patient.  Answered questions.  Gave patient copy of discharge instructions.  Prescriptions were sent to patient's pharmacy.    No additional questions or concerns at this time.    Tech wheeled patient down to car.

## 2023-01-10 NOTE — Plan of Care (Signed)

## 2023-01-10 NOTE — Progress Notes (Signed)
PROGRESS NOTE    Ruben Navarro  UXL:[id number] DOB: [birth year] DOA: 01/08/2023 PCP: Patient, No Pcp Per  Outpatient Specialists: none    Brief Narrative:   Ruben Navarro is a 57 y.o. 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  Approximately 1 week later and 2 days ago he developed pain and redness at the area then developed a large blister that became increasingly painful.  He had been using ibuprofen at home but the pain became so much worse.  He denies significant fever or chills at home.  He presented to [facility] ER where a bedside ultrasound revealed an abscess.  An I&D was performed at the site which drained a copious amount of purulent foul-smelling discharge.  There is still some erythema and induration at the site. Patient was sent to the hospital to undergo I&D under anesthesia.  Underwent surgical incision and drainage 9/9. Blood cultures negative.  Surgical culture with mixed gram-positive and gram-negative rods.  Patient reports being clean of drugs for the last 5 years and this was a one-time lapse in judgment.   Assessment & Plan:   Principal Problem:   Cellulitis and abscess of upper extremity Active Problems:   Cocaine abuse (HCC)   # Abscess/cellulitis Left arm 2/2 IVDU. S/p I and D in ER on 9/8. Culture growing GPCs and GNRs -Currently on vancomycin and ceftriaxone. -Status post I&D by orthopedics. Blood cultures negative so far.  Local cultures with GPC's and GNR's.  MRSA positive in his nares.  Clinically improving.  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  # Basilic vein thrombosis: As seen in CT scan.  This is due to inflammation.  Does not need anticoagulation.   DVT prophylaxis: lovenox Code Status: full Family Communication: none at bedside  Level of care: Med-Surg Status is: Inpatient.  Remains inpatient due to IV antibiotics  needed.    Consultants:  ortho  Procedures: I&D left forearm  Antimicrobials:  vancomycin and Rocephin 9/9---   Subjective: Patient seen and examined.  He tells me that he is doing well.  He wants to go home.  Denies any pain. Waiting to hear from surgery, he may likely be able to go home if surgically stable.  Objective: Vitals:   01/09/23 2037 01/10/23 0340 01/10/23 0556 01/10/23 0809  BP: 123/75 (!) 131/92 (!) 112/56 98/64  Pulse: (!) 53 (!) 58 (!) 49 (!) 53  Resp: 18 19 20 18   Temp: 97.7 F (36.5 C)  (!) 97.5 F (36.4 C) 97.8 F (36.6 C)  TempSrc: Oral     SpO2: 98% 97% 96% 95%  Weight:      Height:        Intake/Output Summary (Last 24 hours) at 01/10/2023 1326 Last data filed at 01/10/2023 0400 Gross per 24 hour  Intake 1172.16 ml  Output 3 ml  Net 1169.16 ml   Filed Weights   01/08/23 1257  Weight: 104.8 kg    Examination:  General: Calm and comfortable.  Interactive.  Alert awake oriented. Cardiovascular: S1-S2 normal.  Regular rate rhythm. Respiratory: Bilateral clear Gastrointestinal: Soft and nontender Ext: No swelling edema.  No cyanosis.  Left forearm on compression dressing.  Distal neurovascular status intact.      Data Reviewed: I have personally reviewed following labs and imaging studies  CBC: Recent Labs  Lab 01/08/23 0812  WBC 8.1  NEUTROABS 5.5  HGB 16.4  HCT 47.7  MCV 92.6  PLT 230   Basic Metabolic Panel: Recent Labs  Lab 01/08/23 0812  NA 138  K 4.0  CL 106  CO2 23  GLUCOSE 96  BUN 10  CREATININE 0.75  CALCIUM 9.2   GFR: Estimated Creatinine Clearance: 129.1 mL/min (by C-G formula based on SCr of 0.75 mg/dL). Liver Function Tests: No results for input(s): "AST", "ALT", "ALKPHOS", "BILITOT", "PROT", "ALBUMIN" in the last 168 hours. No results for input(s): "LIPASE", "AMYLASE" in the last 168 hours. No results for input(s): "AMMONIA" in the last 168 hours. Coagulation Profile: No results for input(s):  "INR", "PROTIME" in the last 168 hours. Cardiac Enzymes: No results for input(s): "CKTOTAL", "CKMB", "CKMBINDEX", "TROPONINI" in the last 168 hours. BNP (last 3 results) No results for input(s): "PROBNP" in the last 8760 hours. HbA1C: No results for input(s): "HGBA1C" in the last 72 hours. CBG: No results for input(s): "GLUCAP" in the last 168 hours. Lipid Profile: No results for input(s): "CHOL", "HDL", "LDLCALC", "TRIG", "CHOLHDL", "LDLDIRECT" in the last 72 hours. Thyroid Function Tests: No results for input(s): "TSH", "T4TOTAL", "FREET4", "T3FREE", "THYROIDAB" in the last 72 hours. Anemia Panel: No results for input(s): "VITAMINB12", "FOLATE", "FERRITIN", "TIBC", "IRON", "RETICCTPCT" in the last 72 hours. Urine analysis: No results found for: "COLORURINE", "APPEARANCEUR", "LABSPEC", "PHURINE", "GLUCOSEU", "HGBUR", "BILIRUBINUR", "KETONESUR", "PROTEINUR", "UROBILINOGEN", "NITRITE", "LEUKOCYTESUR" Sepsis Labs: @LABRCNTIP (procalcitonin:4,lacticidven:4)  ) Recent Results (from the past 240 hour(s))  Blood culture (routine x 2)     Status: None (Preliminary result)   Collection Time: 01/08/23  8:11 AM   Specimen: BLOOD  Result Value Ref Range Status   Specimen Description   Final    BLOOD RIGHT ANTECUBITAL Performed at [facility], [address] [place], [place] [address]    Special Requests   Final    BOTTLES DRAWN AEROBIC AND ANAEROBIC Blood Culture adequate volume Performed at [facility], [address] [place], [place] [address]    Culture   Final    NO GROWTH 2 DAYS Performed at [facility], [address], [place], [place] [address]    Report Status PENDING  Incomplete  Aerobic Culture w Gram Stain (superficial specimen)     Status: None (Preliminary result)   Collection Time: 01/08/23  9:46 AM   Specimen: Abscess; Wound  Result Value Ref Range Status   Specimen Description   Final    ABSCESS S T Performed at [facility], [address], [place], [place] [address]    Special Requests   Final    NONE Performed at [facility], [address], [place], [place] [address]    Gram Stain   Final    FEW WBC PRESENT, PREDOMINANTLY PMN FEW GRAM POSITIVE COCCI FEW GRAM NEGATIVE RODS    Culture   Final    FEW STREPTOCOCCUS GROUP C Beta hemolytic streptococci are predictably susceptible to penicillin and other beta lactams. Susceptibility testing not routinely performed. CULTURE REINCUBATED FOR BETTER GROWTH Performed at [facility], [address], [place], [place] [address]    Report Status PENDING  Incomplete  Surgical pcr screen     Status: Abnormal   Collection Time: 01/09/23  4:00 PM   Specimen: Nasal Mucosa; Nasal Swab  Result Value Ref Range Status   MRSA, PCR NEGATIVE NEGATIVE Final   Staphylococcus aureus POSITIVE (A) NEGATIVE Final    Comment: (NOTE) The Xpert SA Assay (FDA approved for NASAL specimens in patients 53 years of age and older),  is one component of a comprehensive surveillance program. It is not intended to diagnose infection nor to guide or monitor treatment. Performed at [facility], [address], [place], [place] [address]          Radiology Studies: CT ELBOW LEFT W CONTRAST  Result Date: 01/09/2023 CLINICAL DATA:  Concern for soft tissue infection and abscess. EXAM: CT OF THE UPPER LEFT EXTREMITY WITH CONTRAST TECHNIQUE: Multidetector CT imaging of the left elbow was performed according to the standard protocol following intravenous contrast administration. RADIATION DOSE REDUCTION: This exam was performed according to the departmental dose-optimization program which includes automated exposure control, adjustment of the mA and/or kV according to patient size and/or use of iterative reconstruction technique. CONTRAST:  65mL OMNIPAQUE IOHEXOL 350 MG/ML SOLN COMPARISON:  Left elbow radiograph dated 01/08/2023.  FINDINGS: Bones/Joint/Cartilage No acute fracture or dislocation. No significant arthritic changes. No joint effusion. Ligaments Suboptimally assessed by CT. Muscles and Tendons No acute findings.  No intramuscular fluid collection. Soft tissues There is focal area of skin defect and laceration with edema and thickening of the skin in the medial aspect of the anterior elbow. Small pocket of air noted within the skin at the site of the defect. There is focal stranding and edema of the subcutaneous soft tissues. No drainable fluid collection or abscess noted. There is focal area of nonopacification of the basilic vein adjacent to the inflammatory area which may be due to spasm. A thrombus is not excluded. Ultrasound may provide better evaluation if clinically gated. IMPRESSION: 1. Focal area of skin defect and thickening in the medial aspect of the anterior elbow. No drainable fluid collection or abscess. 2. Focal area of nonopacification of the basilic vein adjacent to the inflammatory area may be due to spasm. A thrombus is not excluded. Ultrasound may provide better evaluation if clinically gated. Electronically Signed   By: Elgie Collard M.D.   On: 01/09/2023 17:17        Scheduled Meds:  acetaminophen  1,000 mg Oral Q6H   enoxaparin (LOVENOX) injection  40 mg Subcutaneous Q24H   ibuprofen  600 mg Oral Q6H   mupirocin ointment   Nasal BID   nicotine  21 mg Transdermal Daily   Continuous Infusions:  cefTRIAXone (ROCEPHIN)  IV 1 g (01/10/23 1310)   vancomycin 1,250 mg (01/10/23 0009)     LOS: 1 day     Dorcas Carrow, MD [facility]  Total time spent: 35 minutes

## 2023-01-11 LAB — AEROBIC CULTURE W GRAM STAIN (SUPERFICIAL SPECIMEN)

## 2023-01-13 LAB — CULTURE, BLOOD (ROUTINE X 2)
Culture: NO GROWTH
Special Requests: ADEQUATE

## 2023-01-14 LAB — HCV RT-PCR, QUANT (NON-GRAPH): Hepatitis C Quantitation: NOT DETECTED [IU]/mL

## 2023-01-14 LAB — HCV AB W REFLEX TO QUANT PCR: HCV Ab: REACTIVE — AB
